# Patient Record
Sex: Female | Born: 1935 | Race: White | Hispanic: No | Smoking: Former smoker
Health system: Southern US, Community
[De-identification: ages and names within clinical notes are randomized; demographics above are authoritative.]

## PROBLEM LIST (undated history)

## (undated) DIAGNOSIS — A6000 Herpesviral infection of urogenital system, unspecified: Secondary | ICD-10-CM

## (undated) DIAGNOSIS — F32A Depression, unspecified: Secondary | ICD-10-CM

## (undated) DIAGNOSIS — I509 Heart failure, unspecified: Secondary | ICD-10-CM

## (undated) DIAGNOSIS — D649 Anemia, unspecified: Secondary | ICD-10-CM

## (undated) DIAGNOSIS — K2970 Gastritis, unspecified, without bleeding: Secondary | ICD-10-CM

## (undated) DIAGNOSIS — I10 Essential (primary) hypertension: Secondary | ICD-10-CM

## (undated) DIAGNOSIS — R55 Syncope and collapse: Secondary | ICD-10-CM

## (undated) DIAGNOSIS — E041 Nontoxic single thyroid nodule: Secondary | ICD-10-CM

## (undated) DIAGNOSIS — M545 Low back pain, unspecified: Secondary | ICD-10-CM

## (undated) DIAGNOSIS — E039 Hypothyroidism, unspecified: Secondary | ICD-10-CM

## (undated) DIAGNOSIS — Z8719 Personal history of other diseases of the digestive system: Secondary | ICD-10-CM

## (undated) DIAGNOSIS — F329 Major depressive disorder, single episode, unspecified: Secondary | ICD-10-CM

## (undated) DIAGNOSIS — T4145XA Adverse effect of unspecified anesthetic, initial encounter: Secondary | ICD-10-CM

## (undated) DIAGNOSIS — R0982 Postnasal drip: Secondary | ICD-10-CM

## (undated) DIAGNOSIS — R9439 Abnormal result of other cardiovascular function study: Secondary | ICD-10-CM

## (undated) DIAGNOSIS — R32 Unspecified urinary incontinence: Secondary | ICD-10-CM

## (undated) DIAGNOSIS — F419 Anxiety disorder, unspecified: Secondary | ICD-10-CM

## (undated) DIAGNOSIS — G479 Sleep disorder, unspecified: Secondary | ICD-10-CM

## (undated) DIAGNOSIS — G8929 Other chronic pain: Secondary | ICD-10-CM

## (undated) DIAGNOSIS — M1990 Unspecified osteoarthritis, unspecified site: Secondary | ICD-10-CM

## (undated) DIAGNOSIS — T8859XA Other complications of anesthesia, initial encounter: Secondary | ICD-10-CM

## (undated) DIAGNOSIS — C4441 Basal cell carcinoma of skin of scalp and neck: Secondary | ICD-10-CM

## (undated) DIAGNOSIS — H409 Unspecified glaucoma: Secondary | ICD-10-CM

## (undated) DIAGNOSIS — Z8711 Personal history of peptic ulcer disease: Secondary | ICD-10-CM

## (undated) DIAGNOSIS — Z87440 Personal history of urinary (tract) infections: Secondary | ICD-10-CM

## (undated) DIAGNOSIS — I447 Left bundle-branch block, unspecified: Secondary | ICD-10-CM

## (undated) DIAGNOSIS — M810 Age-related osteoporosis without current pathological fracture: Secondary | ICD-10-CM

## (undated) HISTORY — PX: TONSILLECTOMY: SUR1361

## (undated) HISTORY — PX: TUBAL LIGATION: SHX77

## (undated) HISTORY — DX: Heart failure, unspecified: I50.9

## (undated) HISTORY — PX: JOINT REPLACEMENT: SHX530

## (undated) HISTORY — PX: BASAL CELL CARCINOMA EXCISION: SHX1214

## (undated) HISTORY — PX: TOTAL HIP ARTHROPLASTY: SHX124

## (undated) HISTORY — DX: Unspecified osteoarthritis, unspecified site: M19.90

## (undated) HISTORY — DX: Unspecified glaucoma: H40.9

## (undated) HISTORY — PX: CARDIAC CATHETERIZATION: SHX172

## (undated) HISTORY — PX: BIOPSY THYROID: PRO38

## (undated) HISTORY — PX: APPENDECTOMY: SHX54

## (undated) HISTORY — DX: Abnormal result of other cardiovascular function study: R94.39

## (undated) HISTORY — DX: Left bundle-branch block, unspecified: I44.7

## (undated) HISTORY — PX: CATARACT EXTRACTION W/ INTRAOCULAR LENS  IMPLANT, BILATERAL: SHX1307

---

## 1998-02-17 ENCOUNTER — Ambulatory Visit (HOSPITAL_COMMUNITY): Admission: RE | Admit: 1998-02-17 | Discharge: 1998-02-17 | Payer: Self-pay | Admitting: Obstetrics & Gynecology

## 1998-07-04 ENCOUNTER — Ambulatory Visit (HOSPITAL_COMMUNITY): Admission: RE | Admit: 1998-07-04 | Discharge: 1998-07-04 | Payer: Self-pay | Admitting: Obstetrics & Gynecology

## 1999-07-24 ENCOUNTER — Other Ambulatory Visit: Admission: RE | Admit: 1999-07-24 | Discharge: 1999-07-24 | Payer: Self-pay | Admitting: Obstetrics & Gynecology

## 1999-07-24 ENCOUNTER — Ambulatory Visit (HOSPITAL_COMMUNITY): Admission: RE | Admit: 1999-07-24 | Discharge: 1999-07-24 | Payer: Self-pay | Admitting: *Deleted

## 1999-07-24 ENCOUNTER — Ambulatory Visit (HOSPITAL_COMMUNITY): Admission: RE | Admit: 1999-07-24 | Discharge: 1999-07-24 | Payer: Self-pay | Admitting: Obstetrics & Gynecology

## 2000-07-29 ENCOUNTER — Ambulatory Visit (HOSPITAL_COMMUNITY): Admission: RE | Admit: 2000-07-29 | Discharge: 2000-07-29 | Payer: Self-pay | Admitting: *Deleted

## 2000-08-10 ENCOUNTER — Ambulatory Visit (HOSPITAL_COMMUNITY): Admission: RE | Admit: 2000-08-10 | Discharge: 2000-08-10 | Payer: Self-pay

## 2001-08-11 ENCOUNTER — Ambulatory Visit (HOSPITAL_COMMUNITY): Admission: RE | Admit: 2001-08-11 | Discharge: 2001-08-11 | Payer: Self-pay | Admitting: *Deleted

## 2002-08-17 ENCOUNTER — Ambulatory Visit (HOSPITAL_COMMUNITY): Admission: RE | Admit: 2002-08-17 | Discharge: 2002-08-17 | Payer: Self-pay | Admitting: *Deleted

## 2002-08-17 ENCOUNTER — Ambulatory Visit (HOSPITAL_COMMUNITY): Admission: RE | Admit: 2002-08-17 | Discharge: 2002-08-17 | Payer: Self-pay | Admitting: Family Medicine

## 2003-06-21 ENCOUNTER — Ambulatory Visit (HOSPITAL_COMMUNITY): Admission: RE | Admit: 2003-06-21 | Discharge: 2003-06-21 | Payer: Self-pay | Admitting: *Deleted

## 2003-08-20 ENCOUNTER — Ambulatory Visit (HOSPITAL_COMMUNITY): Admission: RE | Admit: 2003-08-20 | Discharge: 2003-08-20 | Payer: Self-pay | Admitting: Family Medicine

## 2004-08-20 ENCOUNTER — Ambulatory Visit: Payer: Self-pay | Admitting: *Deleted

## 2004-08-20 ENCOUNTER — Ambulatory Visit (HOSPITAL_COMMUNITY): Admission: RE | Admit: 2004-08-20 | Discharge: 2004-08-20 | Payer: Self-pay | Admitting: Surgery

## 2005-10-06 ENCOUNTER — Ambulatory Visit (HOSPITAL_COMMUNITY): Admission: RE | Admit: 2005-10-06 | Discharge: 2005-10-06 | Payer: Self-pay | Admitting: Surgery

## 2005-10-21 ENCOUNTER — Ambulatory Visit: Payer: Self-pay | Admitting: *Deleted

## 2005-10-28 ENCOUNTER — Encounter: Admission: RE | Admit: 2005-10-28 | Discharge: 2005-10-28 | Payer: Self-pay | Admitting: Surgery

## 2006-05-11 ENCOUNTER — Inpatient Hospital Stay (HOSPITAL_BASED_OUTPATIENT_CLINIC_OR_DEPARTMENT_OTHER): Admission: RE | Admit: 2006-05-11 | Discharge: 2006-05-11 | Payer: Self-pay | Admitting: Cardiovascular Disease

## 2006-07-14 ENCOUNTER — Encounter (HOSPITAL_COMMUNITY): Admission: RE | Admit: 2006-07-14 | Discharge: 2006-10-12 | Payer: Self-pay | Admitting: Cardiovascular Disease

## 2006-10-13 ENCOUNTER — Encounter (HOSPITAL_COMMUNITY): Admission: RE | Admit: 2006-10-13 | Discharge: 2006-10-22 | Payer: Self-pay | Admitting: Cardiology

## 2006-10-25 ENCOUNTER — Encounter (HOSPITAL_COMMUNITY): Admission: RE | Admit: 2006-10-25 | Discharge: 2007-01-17 | Payer: Self-pay | Admitting: Cardiovascular Disease

## 2007-01-18 ENCOUNTER — Encounter (HOSPITAL_COMMUNITY): Admission: RE | Admit: 2007-01-18 | Discharge: 2007-01-18 | Payer: Self-pay | Admitting: Cardiovascular Disease

## 2007-07-25 ENCOUNTER — Encounter: Admission: RE | Admit: 2007-07-25 | Discharge: 2007-07-25 | Payer: Self-pay | Admitting: Orthopedic Surgery

## 2008-01-15 ENCOUNTER — Encounter: Admission: RE | Admit: 2008-01-15 | Discharge: 2008-01-15 | Payer: Self-pay | Admitting: Neurology

## 2008-02-12 ENCOUNTER — Encounter: Admission: RE | Admit: 2008-02-12 | Discharge: 2008-02-21 | Payer: Self-pay | Admitting: Neurology

## 2010-07-29 ENCOUNTER — Inpatient Hospital Stay (HOSPITAL_COMMUNITY): Admission: RE | Admit: 2010-07-29 | Discharge: 2010-08-03 | Payer: Self-pay | Admitting: Orthopedic Surgery

## 2010-07-29 ENCOUNTER — Ambulatory Visit: Payer: Self-pay | Admitting: Internal Medicine

## 2010-07-31 ENCOUNTER — Ambulatory Visit: Payer: Self-pay | Admitting: Vascular Surgery

## 2010-07-31 ENCOUNTER — Encounter (INDEPENDENT_AMBULATORY_CARE_PROVIDER_SITE_OTHER): Payer: Self-pay | Admitting: Internal Medicine

## 2010-11-08 ENCOUNTER — Encounter: Payer: Self-pay | Admitting: Surgery

## 2010-12-22 ENCOUNTER — Other Ambulatory Visit: Payer: Self-pay | Admitting: Orthopedic Surgery

## 2010-12-22 ENCOUNTER — Encounter (HOSPITAL_COMMUNITY): Payer: Medicare Other

## 2010-12-22 ENCOUNTER — Ambulatory Visit (HOSPITAL_COMMUNITY)
Admission: RE | Admit: 2010-12-22 | Discharge: 2010-12-22 | Disposition: A | Discharge status: Home / Self Care | Payer: Medicare Other | Source: Ambulatory Visit | Attending: Orthopedic Surgery | Admitting: Orthopedic Surgery

## 2010-12-22 ENCOUNTER — Other Ambulatory Visit (HOSPITAL_COMMUNITY): Payer: Self-pay | Admitting: Orthopedic Surgery

## 2010-12-22 DIAGNOSIS — M169 Osteoarthritis of hip, unspecified: Secondary | ICD-10-CM

## 2010-12-22 DIAGNOSIS — Z01812 Encounter for preprocedural laboratory examination: Secondary | ICD-10-CM | POA: Insufficient documentation

## 2010-12-22 DIAGNOSIS — M1610 Unilateral primary osteoarthritis, unspecified hip: Secondary | ICD-10-CM | POA: Insufficient documentation

## 2010-12-22 DIAGNOSIS — Z96649 Presence of unspecified artificial hip joint: Secondary | ICD-10-CM | POA: Insufficient documentation

## 2010-12-22 DIAGNOSIS — Z01811 Encounter for preprocedural respiratory examination: Secondary | ICD-10-CM | POA: Insufficient documentation

## 2010-12-22 LAB — URINALYSIS, ROUTINE W REFLEX MICROSCOPIC
Bilirubin Urine: NEGATIVE
Glucose, UA: NEGATIVE mg/dL
Hgb urine dipstick: NEGATIVE
Ketones, ur: NEGATIVE mg/dL
Nitrite: NEGATIVE
Protein, ur: NEGATIVE mg/dL
Specific Gravity, Urine: 1.006 (ref 1.005–1.030)
Urobilinogen, UA: 0.2 mg/dL (ref 0.0–1.0)
pH: 6.5 (ref 5.0–8.0)

## 2010-12-22 LAB — CBC
HCT: 44.7 % (ref 36.0–46.0)
Hemoglobin: 14.7 g/dL (ref 12.0–15.0)
MCH: 30.6 pg (ref 26.0–34.0)
MCHC: 32.9 g/dL (ref 30.0–36.0)
MCV: 92.9 fL (ref 78.0–100.0)
RBC: 4.81 MIL/uL (ref 3.87–5.11)
RDW: 13.4 % (ref 11.5–15.5)

## 2010-12-22 LAB — COMPREHENSIVE METABOLIC PANEL
ALT: 16 U/L (ref 0–35)
AST: 22 U/L (ref 0–37)
BUN: 11 mg/dL (ref 6–23)
CO2: 32 mEq/L (ref 19–32)
Calcium: 9.4 mg/dL (ref 8.4–10.5)
Chloride: 100 mEq/L (ref 96–112)
Creatinine, Ser: 0.73 mg/dL (ref 0.4–1.2)
GFR calc non Af Amer: >60 mL/min (ref >60)
Glucose, Bld: 84 mg/dL (ref 70–99)
Total Bilirubin: 0.9 mg/dL (ref 0.3–1.2)
Total Protein: 6.4 g/dL (ref 6.0–8.3)

## 2010-12-22 LAB — SURGICAL PCR SCREEN
MRSA, PCR: NEGATIVE
Staphylococcus aureus: NEGATIVE

## 2010-12-22 LAB — APTT: aPTT: 33 seconds (ref 24–37)

## 2010-12-22 LAB — PROTIME-INR
INR: 1 (ref 0.00–1.49)
Prothrombin Time: 13.4 seconds (ref 11.6–15.2)

## 2010-12-28 ENCOUNTER — Inpatient Hospital Stay (HOSPITAL_COMMUNITY): Payer: Medicare Other

## 2010-12-28 ENCOUNTER — Inpatient Hospital Stay (HOSPITAL_COMMUNITY)
Admission: RE | Admit: 2010-12-28 | Discharge: 2010-12-31 | DRG: 470 | Disposition: A | Discharge status: Skilled Nursing Facility | Payer: Medicare Other | Source: Ambulatory Visit | Attending: Orthopedic Surgery | Admitting: Orthopedic Surgery

## 2010-12-28 DIAGNOSIS — M169 Osteoarthritis of hip, unspecified: Principal | ICD-10-CM | POA: Diagnosis present

## 2010-12-28 DIAGNOSIS — D62 Acute posthemorrhagic anemia: Secondary | ICD-10-CM | POA: Diagnosis not present

## 2010-12-28 DIAGNOSIS — M1610 Unilateral primary osteoarthritis, unspecified hip: Principal | ICD-10-CM | POA: Diagnosis present

## 2010-12-28 DIAGNOSIS — Z96649 Presence of unspecified artificial hip joint: Secondary | ICD-10-CM

## 2010-12-28 DIAGNOSIS — H9319 Tinnitus, unspecified ear: Secondary | ICD-10-CM | POA: Diagnosis present

## 2010-12-28 DIAGNOSIS — E039 Hypothyroidism, unspecified: Secondary | ICD-10-CM | POA: Diagnosis present

## 2010-12-28 DIAGNOSIS — F411 Generalized anxiety disorder: Secondary | ICD-10-CM | POA: Diagnosis present

## 2010-12-28 DIAGNOSIS — I10 Essential (primary) hypertension: Secondary | ICD-10-CM | POA: Diagnosis present

## 2010-12-28 DIAGNOSIS — H409 Unspecified glaucoma: Secondary | ICD-10-CM | POA: Diagnosis present

## 2010-12-28 LAB — TYPE AND SCREEN: ABO/RH(D): O POS

## 2010-12-29 LAB — CBC
HCT: 29.2 % — ABNORMAL LOW (ref 36.0–46.0)
Hemoglobin: 9.8 g/dL — ABNORMAL LOW (ref 12.0–15.0)
MCHC: 33.6 g/dL (ref 30.0–36.0)
MCV: 93 fL (ref 78.0–100.0)
Platelets: 167 10*3/uL (ref 150–400)
RDW: 13.2 % (ref 11.5–15.5)
WBC: 6.2 10*3/uL (ref 4.0–10.5)

## 2010-12-29 LAB — BASIC METABOLIC PANEL
BUN: 5 mg/dL — ABNORMAL LOW (ref 6–23)
CO2: 29 mEq/L (ref 19–32)
Calcium: 8.3 mg/dL — ABNORMAL LOW (ref 8.4–10.5)
Chloride: 104 mEq/L (ref 96–112)
Creatinine, Ser: 0.81 mg/dL (ref 0.4–1.2)
GFR calc non Af Amer: >60 mL/min (ref >60)
Glucose, Bld: 203 mg/dL — ABNORMAL HIGH (ref 70–99)
Potassium: 4.9 mEq/L (ref 3.5–5.1)
Sodium: 138 mEq/L (ref 135–145)

## 2010-12-30 LAB — BASIC METABOLIC PANEL
BUN: 5 mg/dL — ABNORMAL LOW (ref 6–23)
BUN: 9 mg/dL (ref 6–23)
CO2: 32 mEq/L (ref 19–32)
CO2: 31 mEq/L (ref 19–32)
Calcium: 8.5 mg/dL (ref 8.4–10.5)
Calcium: 8.3 mg/dL — ABNORMAL LOW (ref 8.4–10.5)
Chloride: 100 mEq/L (ref 96–112)
Creatinine, Ser: 0.75 mg/dL (ref 0.4–1.2)
Creatinine, Ser: 0.68 mg/dL (ref 0.4–1.2)
GFR calc Af Amer: >60 mL/min (ref >60)
GFR calc Af Amer: >60 mL/min (ref >60)
GFR calc non Af Amer: >60 mL/min (ref >60)
GFR calc non Af Amer: >60 mL/min (ref >60)
Glucose, Bld: 147 mg/dL — ABNORMAL HIGH (ref 70–99)
Glucose, Bld: 141 mg/dL — ABNORMAL HIGH (ref 70–99)
Potassium: 4.3 mEq/L (ref 3.5–5.1)
Potassium: 4.1 mEq/L (ref 3.5–5.1)
Sodium: 142 mEq/L (ref 135–145)
Sodium: 136 mEq/L (ref 135–145)

## 2010-12-30 LAB — CBC
HCT: 28 % — ABNORMAL LOW (ref 36.0–46.0)
HCT: 33.2 % — ABNORMAL LOW (ref 36.0–46.0)
Hemoglobin: 9.1 g/dL — ABNORMAL LOW (ref 12.0–15.0)
Hemoglobin: 11.5 g/dL — ABNORMAL LOW (ref 12.0–15.0)
MCH: 30.3 pg (ref 26.0–34.0)
MCH: 30.7 pg (ref 26.0–34.0)
MCHC: 32.5 g/dL (ref 30.0–36.0)
MCHC: 34.4 g/dL (ref 30.0–36.0)
MCV: 93.3 fL (ref 78.0–100.0)
MCV: 89.2 fL (ref 78.0–100.0)
Platelets: 156 10*3/uL (ref 150–400)
Platelets: 118 10*3/uL — ABNORMAL LOW (ref 150–400)
RBC: 3.72 MIL/uL — ABNORMAL LOW (ref 3.87–5.11)
RDW: 13.2 % (ref 11.5–15.5)
RDW: 17.6 % — ABNORMAL HIGH (ref 11.5–15.5)
WBC: 7.1 10*3/uL (ref 4.0–10.5)

## 2010-12-30 LAB — PROTIME-INR
INR: 1.54 — ABNORMAL HIGH (ref 0.00–1.49)
INR: 1.77 — ABNORMAL HIGH (ref 0.00–1.49)
Prothrombin Time: 20.8 seconds — ABNORMAL HIGH (ref 11.6–15.2)
Prothrombin Time: 21.5 seconds — ABNORMAL HIGH (ref 11.6–15.2)

## 2010-12-30 LAB — CARDIAC PANEL(CRET KIN+CKTOT+MB+TROPI)
CK, MB: 6.3 ng/mL (ref 0.3–4.0)
Relative Index: 6.1 — ABNORMAL HIGH (ref 0.0–2.5)
Total CK: 103 U/L (ref 7–177)
Troponin I: 0.99 ng/mL (ref 0.00–0.06)

## 2010-12-30 NOTE — H&P (Addendum)
NAME:  Maria [name], Maria [name] NO.:  [id number]  MEDICAL RECORD NO.:  [id number]           PATIENT TYPE:  LOCATION:                                 FACILITY:  PHYSICIAN:  Ollen Gross, M.D.    DATE OF BIRTH:  [birth year]  DATE OF ADMISSION: DATE OF DISCHARGE:                             HISTORY & PHYSICAL   CHIEF COMPLAINT:  Right hip pain.  BRIEF HISTORY:  Maria [name] has been followed by Dr. Lequita Halt for worsening pain in her right hip.  She had a left total hip arthroplasty about 5- 1/2 months ago.  She has done very well with that.  She now feels that the right hip continues to worsen and is now preventing her from doing things she would like to do.  She now presents for right total hip arthroplasty.  Maria [name] has been cleared for surgery by her primary care physician, Dr. Asencion Islam, and she is noted to be a low risk.  MEDICATION ALLERGIES:  She states she is sensitive to ASPIRIN.  This has caused her to have ulcers in the past.  CURRENT MEDICATIONS: 1. Carvedilol. 2. Lisinopril. 3. Armour Thyroid. 4. Venlafaxine. 5. Gabapentin. 6. Zovirax. 7. Dulse. 8. Lanney Gins San. 9. Fish oil. 10.Vitamin D3. 11.Lutein. 12.BCQ.  Maria [name] will discontinue all supplements prior to surgery.  PAST MEDICAL HISTORY: 1. End-stage arthritis of right hip. 2. Tinnitus. 3. Glaucoma. 4. Cataracts. 5. Impaired vision. 6. History of heart failure. 7. Thyroid disease. 8. Arthritis. 9. Degenerative disk disease.  PAST SURGICAL HISTORY:  Left total hip arthroplasty October 2011.  FAMILY HISTORY:  Father passed at age 6, old age.  Mother passed at 1. She had heart trouble.  SOCIAL HISTORY:  The patient is single.  She works as a Clinical research associate.  She denies past or present use of tobacco products.  She states she has an occasional glass of wine.  She has 2 children.  She lives alone.  She will need to go to rehab facility following her hospital stay and her first choice is [place] and  second choice is [facility].  REVIEW OF SYSTEMS:  GENERAL:  Negative for fevers, chills, or weight change.  HEENT/NEUROLOGIC:  Positive for ringing in the ears.  Negative for headache or blurred vision.  DERMATOLOGIC:  Negative for rash or lesion.  RESPIRATORY:  Negative for shortness of breath at rest or on exertion.  CARDIOVASCULAR:  Positive for slight swelling.  Negative for chest pain or palpitations.  GASTROINTESTINAL:  Negative for nausea, vomiting, or diarrhea.  GENITOURINARY:  Positive for urinary frequency, urinating at nighttime, and urinary incontinence.  MUSCULOSKELETAL: Positive for joint pain and morning stiffness.  PHYSICAL EXAMINATION:  VITAL SIGNS:  Pulse 80, respirations 18, blood pressure 118/70 in the left arm. GENERAL:  Maria [name] is a pleasant 77 year old female.  She is a stated height of 5 feet 5 inches and a stated weight of 140 pounds.  She is alert and oriented in no distress. HEENT:  Normocephalic, atraumatic.  Extraocular movements intact.  The patient wears glasses. NECK:  Supple, full range of motion  without lymphadenopathy. CHEST:  Lungs are clear to auscultation. HEART:  Regular rate and rhythm without murmur, S1 and S2 sounds appreciated. ABDOMEN:  Bowel sounds present in all 4 quadrants. EXTREMITIES:  Right hip significantly decreased range of motion with pain throughout the range. SKIN:  Unremarkable. NEUROLOGIC:  Intact. PERIPHERAL VASCULAR:  Carotid pulses 2+ bilaterally without bruit.  RADIOGRAPHS:  End-stage arthritis of the right hip.  IMPRESSION:  End-stage arthritis of the right hip.  PLAN:  Right total hip arthroplasty to be performed by Dr. Lequita Halt.     Rozell Searing, PAC   ______________________________ Ollen Gross, M.D.    LD/MEDQ  D:  12/29/2010  T:  12/30/2010  Job:  [id number]  Electronically Signed by Rozell Searing  on 12/30/2010 10:44:30 AM Electronically Signed by Ollen Gross M.D. on 01/06/2011 08:18:38 AM

## 2010-12-31 LAB — BASIC METABOLIC PANEL
BUN: 9 mg/dL (ref 6–23)
Calcium: 8.1 mg/dL — ABNORMAL LOW (ref 8.4–10.5)
Chloride: 99 mEq/L (ref 96–112)
Creatinine, Ser: 0.8 mg/dL (ref 0.4–1.2)
Creatinine, Ser: 0.86 mg/dL (ref 0.4–1.2)
GFR calc Af Amer: >60 mL/min (ref >60)
GFR calc non Af Amer: >60 mL/min (ref >60)
Glucose, Bld: 153 mg/dL — ABNORMAL HIGH (ref 70–99)
Potassium: 4.7 mEq/L (ref 3.5–5.1)

## 2010-12-31 LAB — TYPE AND SCREEN
Antibody Screen: NEGATIVE
Unit division: 0

## 2010-12-31 LAB — COMPREHENSIVE METABOLIC PANEL
AST: 22 U/L (ref 0–37)
BUN: 14 mg/dL (ref 6–23)
CO2: 30 mEq/L (ref 19–32)
Chloride: 109 mEq/L (ref 96–112)
Creatinine, Ser: 0.71 mg/dL (ref 0.4–1.2)
GFR calc non Af Amer: >60 mL/min (ref >60)
Glucose, Bld: 112 mg/dL — ABNORMAL HIGH (ref 70–99)
Total Bilirubin: 0.4 mg/dL (ref 0.3–1.2)

## 2010-12-31 LAB — URINALYSIS, ROUTINE W REFLEX MICROSCOPIC
Bilirubin Urine: NEGATIVE
Bilirubin Urine: NEGATIVE
Hgb urine dipstick: NEGATIVE
Ketones, ur: NEGATIVE mg/dL
Nitrite: NEGATIVE
Specific Gravity, Urine: 1.009 (ref 1.005–1.030)
Urobilinogen, UA: 0.2 mg/dL (ref 0.0–1.0)
pH: 6 (ref 5.0–8.0)

## 2010-12-31 LAB — CBC
HCT: 24.8 % — ABNORMAL LOW (ref 36.0–46.0)
HCT: 42.7 % (ref 36.0–46.0)
Hemoglobin: 15.1 g/dL — ABNORMAL HIGH (ref 12.0–15.0)
Hemoglobin: 8.4 g/dL — ABNORMAL LOW (ref 12.0–15.0)
MCH: 33.1 pg (ref 26.0–34.0)
MCHC: 34 g/dL (ref 30.0–36.0)
MCV: 93.7 fL (ref 78.0–100.0)
MCV: 93.7 fL (ref 78.0–100.0)
MCV: 95.2 fL (ref 78.0–100.0)
Platelets: 114 10*3/uL — ABNORMAL LOW (ref 150–400)
Platelets: 141 10*3/uL — ABNORMAL LOW (ref 150–400)
RBC: 2.89 MIL/uL — ABNORMAL LOW (ref 3.87–5.11)
RBC: 4.56 MIL/uL (ref 3.87–5.11)
RBC: 2.71 MIL/uL — ABNORMAL LOW (ref 3.87–5.11)
RDW: 13.6 % (ref 11.5–15.5)
WBC: 6.6 10*3/uL (ref 4.0–10.5)

## 2010-12-31 LAB — PROTIME-INR
INR: 0.95 (ref 0.00–1.49)
INR: 1.11 (ref 0.00–1.49)
INR: 1.38 (ref 0.00–1.49)
Prothrombin Time: 12.9 seconds (ref 11.6–15.2)
Prothrombin Time: 14.5 seconds (ref 11.6–15.2)

## 2010-12-31 LAB — CARDIAC PANEL(CRET KIN+CKTOT+MB+TROPI)
CK, MB: 5.1 ng/mL — ABNORMAL HIGH (ref 0.3–4.0)
CK, MB: 5.3 ng/mL — ABNORMAL HIGH (ref 0.3–4.0)
CK, MB: 5.5 ng/mL — ABNORMAL HIGH (ref 0.3–4.0)
CK, MB: 9.7 ng/mL (ref 0.3–4.0)
Relative Index: 3.2 — ABNORMAL HIGH (ref 0.0–2.5)
Relative Index: 3.8 — ABNORMAL HIGH (ref 0.0–2.5)
Total CK: 151 U/L (ref 7–177)
Troponin I: 0.33 ng/mL — ABNORMAL HIGH (ref 0.00–0.06)
Troponin I: 1.52 ng/mL (ref 0.00–0.06)

## 2010-12-31 LAB — ABO/RH: ABO/RH(D): O POS

## 2010-12-31 LAB — URINE CULTURE: Special Requests: NEGATIVE

## 2010-12-31 LAB — APTT: aPTT: 31 seconds (ref 24–37)

## 2011-01-06 NOTE — Op Note (Signed)
NAME:  Maria [name], Maria [name] NO.:  [id number]  MEDICAL RECORD NO.:  [id number]           PATIENT TYPE:  I  LOCATION:  1619                         FACILITY:  [facility]  PHYSICIAN:  Ollen Gross, M.D.    DATE OF BIRTH:  [birth year]  DATE OF PROCEDURE:  12/28/2010 DATE OF DISCHARGE:                              OPERATIVE REPORT   PREOPERATIVE DIAGNOSIS:  Osteoarthritis, right hip.  POSTOPERATIVE DIAGNOSIS:  Osteoarthritis, right hip.  PROCEDURE:  Right total hip arthroplasty.  SURGEON:  Ollen Gross, MD  ASSISTANT:  Alexzandrew L. Julien Girt, PA-C  ANESTHESIA:  General.  ESTIMATED BLOOD LOSS:  500.  DRAIN:  Hemovac x1.  COMPLICATIONS:  None.  CONDITION:  Stable to Recovery.  BRIEF CLINICAL NOTE:  Maria [name] is a 77 year old female with advanced end- stage arthritis of the right hip with progressively worsening pain and dysfunction.  She had a recent successful left total hip arthroplasty and presents now for right total hip arthroplasty.  PROCEDURE IN DETAIL:  After successful administration of general anesthetic, the patient was placed in left lateral decubitus position with the right side up and held a hip positioner.  Right lower extremity was isolated from its perineum with plastic drapes and prepped and draped in the usual sterile fashion.  Short posterolateral incision was made with a 10 blade through subcutaneous tissue to the fascia lata which was incised in line with the skin incision.  The sciatic nerve was palpated and protected and short rotators and capsule isolated off the femur.  Hip was dislocated and center of the femoral head was marked.  A trial prosthesis was placed such that the center of the trial head corresponded to the center of the native femoral head.  Osteotomy line was marked on the femoral neck and osteotomy made with an oscillating saw.  Femoral head was then removed.  Retractors were placed around the proximal femur to gain  access to the canal.  The starter reamer was passed into the femoral canal and the canal was thoroughly irrigated to remove fatty contents.  Axial reaming was performed with a 15.5 mm proximal, reaming to 103F and the sleeve machined to a large.  An 103F large trial sleeve was placed.  The femur was retracted anteriorly to gain acetabular exposure. Acetabular retractors were placed and labrum and osteophytes removed. She had a massive rim of circumferential osteophyte which was removed. I reamed up to 49 mm of placement of 50 mm Pinnacle acetabular shell which was placed in anatomic position and transfixed with 2 dome screws. Apex hole eliminator was placed and the 32 mm neutral +4 Marathon liner was placed.  The trial stem was then placed which was a 20 x 15 with a 36 +8 neck matching native anteversion.  A 32 +0 head was placed and the hip was reduced with outstanding stability.  She had full extension, full external rotation, 70 degrees flexion, 40 degrees adduction, 90 degrees internal rotation, 90 degrees of flexion, and 70 degrees of internal rotation.  By placing the right leg on top of the left, it felt as though leg  lengths were equal.  Hip was dislocated and trials removed.  The permanent 75F large sleeve was placed. The 75F large permanent sleeve was placed.  The 20 x 15 stem with a 36 +8 neck was placed matching native anteversion.  A 32 +0 head was placed and the hip was reduced with the same stability parameters.  Wound was copiously irrigated with saline solution and then the short rotators and capsule reattached to the femur through drill holes with Ethibond suture. Fascia lata was closed over Hemovac drain with interrupted #1 Vicryl, subcutaneous closed with #1-0 and #2-0 Vicryl, and subcuticular with running 4-0 Monocryl.  Drain was hooked to suction.  Catheter for the Marcaine pain pump was placed and the pump was initiated.  Incision was cleaned and dried and  Steri-Strips and a bulky sterile dressing applied. She was placed into a knee immobilizer, awakened, and transported to Recovery in stable condition.     Ollen Gross, M.D.     FA/MEDQ  D:  12/28/2010  T:  12/28/2010  Job:  [id number]  Electronically Signed by Ollen Gross M.D. on 01/06/2011 08:18:31 AM

## 2011-01-11 NOTE — Discharge Summary (Signed)
NAME:  Maria [name], DEMARAIS NO.:  [id number]  MEDICAL RECORD NO.:  [id number]           PATIENT TYPE:  I  LOCATION:  1619                         FACILITY:  [facility]  PHYSICIAN:  Ollen Gross, M.D.    DATE OF BIRTH:  [birth year]  DATE OF ADMISSION:  12/28/2010 DATE OF DISCHARGE:  12/31/2010                        DISCHARGE SUMMARY - REFERRING   PRIMARY CARE PHYSICIAN:  Austin Miles. Asencion Islam, M.D.  ADMITTING DIAGNOSES: 1. Osteoarthritis right hip. 2. Tinnitus. 3. Glaucoma. 4. Cataracts number 5. History of heart failure. 6. Hypothyroidism. 7. Osteoarthritis. 8. Degenerative disk disease.  DISCHARGE DIAGNOSES: 1. Osteoarthritis, right hip, status post right total hip replacement     arthroplasty. 2. Postop acute blood loss anemia, did not require transfusion. 3. Tinnitus. 4. Glaucoma. 5. Cataracts number 6. History of heart failure. 7. Hypothyroidism. 8. Osteoarthritis. 9. Degenerative disk disease.PROCEDURE:  December 28, 2010, right total hip.  Surgeon, Dr. Lequita Halt. Assistant, Alexzandrew L. Perkins, P.A.C.  Anesthesia, general.  Blood loss, 500 cc.  CONSULTS:  None.  BRIEF HISTORY:  The patient is a 77 year old female with advanced arthritis of the right hip with progressive worsening pain and dysfunction.  She has had a successful left total hip and now presents for right total hip.  LABORATORY DATA:  Preop CBC showed hemoglobin of 14.79, hematocrit of 44.7, white cell count 6, platelets 205.  PT/INR 13.4 and 1 with PTT of 33.  Chem panel on admission all within normal limits.  Preop UA was negative.  Blood group type is O positive.  Nasal swabs were negative for staph aureus and negative for MRSA.  Serial CBCs were followed. Hemoglobin was down to 9.8 on postop day #1, then 9.1 last night.  H and H was 8.4 and 25.2.  Serial BMETs were followed for 48 hours. Electrolytes remained all within normal limits but the glucose went up from 83 to 203, back down  to 147.  EKG dated July 30, 2010, normal sinus rhythm with sinus arrhythmia, left axis deviation, left bundle- branch block, since previous tracing rate is faster July 21, 2010, confirmed by Dr. Viann Fish.  HOSPITAL COURSE:  The patient admitted to [facility], taken to OR, underwent above-stated procedure without complications.  The patient tolerated the procedure well, later transferred to recovery room on orthopedic floor.  She was given 24 hours postop IV antibiotics.  She was started on p.o. and IV analgesics for pain control following surgery.  She was also placed on Xarelto for DVT prophylaxis.  She was allowed to be partial weightbearing 25% to 50% to the right lower extremity.  She is actually doing pretty well on the morning of day #1, started getting up out of bed with therapy.  She was started back on her home meds.  She did want to look into skilled facility, so we got social work involved right away.  Hemoglobin was down a little bit to 9.8, so we put her on some iron supplementation.  By day #2, she was doing well, she was up walking with therapy progressing short distances.  Dressing was changed.  Incision looked  good.  No signs of infection.  By the morning of day #3, her hemoglobin was down to 8.4 but she was asymptomatic with this.  Her pressures was fine and her pulse was fine. Therefore, we are just going to keep her on the iron supplement.  It was noted that bed should be available at [facility] and she is progressing with therapy.  Once the bed was confirmed, she will be transferred over today on December 31, 2010.  DISCHARGE/PLAN: 1. The patient to be transferred to [facility] on December 31, 2010. 2. Discharge diagnoses, please see above. 3. Discharge medications.  Current medications at the time of transfer include: 1. Neurontin 600 mg p.o. q.h.s. 2. Effexor 25 mg p.o. q.h.s. 3. Armour Thyroid 30 mg p.o. q.a.m. 4. Coreg 3.125 mg  p.o. twice a day. 5. Lisinopril 5 mg p.o. q.a.m. 6. Xarelto 10 mg p.o. daily for 3 weeks, then discontinue the Xarelto. 7. Colace 100 mg p.o. b.i.d. 8. Nu-Iron 150 mg p.o. b.i.d. for 3 weeks, then discontinue the Nu-     Iron. 9. Astelin nasal spray 137 mcg nasally daily p.r.n. 10.Celebrex 200 mg p.o. daily p.r.n. pain. 11.Acyclovir 5% ointment p.r.n. outbreak topical. 12.Tylenol 325 one or two every 4 to 6 hours as needed for mild pain,     temperature or headache. 13.Laxative of choice. 14.Enema of choice. 15.Robaxin 500 mg p.o. q.6 h p.r.n. spasm. 16.Percocet 5 mg 1 or 2 every 4 to 6 hours as needed for moderate to     severe pain.  DIET:  Heart-healthy diet as tolerated.  ACTIVITY:  She is partial weightbearing to the right lower extremity, 25- 50% weightbearing.  Hip precautions, total hip protocol.  Please note the patient may start showering once she is transferred to [facility], however, do not submerge the incision under water; daily dressing change.  FOLLOWUP:  The patient would need to follow up Dr. Lequita Halt on Tuesday, the 27th.  Please contact the office at [phone] to help arrange appointment followup care of this patient on Tuesday, the 27th, with Dr. Lequita Halt.  DISPOSITION:  Maria [name], Maria [name].  CONDITION ON DISCHARGE:  Improving.     Alexzandrew L. Julien Girt, P.A.C.   ______________________________ Ollen Gross, M.D.    ALP/MEDQ  D:  12/31/2010  T:  12/31/2010  Job:  [id number]  cc:   Lanora Manis A. Asencion Islam, M.D. Fax: [phone]  Alba Destine  Electronically Signed by Patrica Duel P.A.C. on 01/08/2011 10:29:22 AM Electronically Signed by Ollen Gross M.D. on 01/11/2011 07:10:32 AM

## 2011-03-05 NOTE — Group Therapy Note (Signed)
NAME:  SHAOLIN, ARMAS NO.:  [id number]   MEDICAL RECORD NO.:  [id number]          PATIENT TYPE:  WOC   LOCATION:  [facility]                   FACILITY:  [facility]   PHYSICIAN:  Kathlyn Sacramento, M.D.   DATE OF BIRTH:  Jun 13, 1931   DATE OF SERVICE:                                    CLINIC NOTE   CHIEF COMPLAINT:  Here for a pelvic exam.   HISTORY OF PRESENT ILLNESS:  The patient is a 77 year old, postmenopausal,  multiparous female coming in for her annual pelvic exam.  She has never had  an abnormal Pap smear, and her last Pap smear was two years ago.  She has  had a breast exam this year with her primary care Yvonna Brun.   ALLERGIES:  ASPIRIN.   MEDICATIONS:  Supplements.   PHYSICAL EXAMINATION:  VITAL SIGNS: Temp 99, pulse 77, blood pressure  153/87, weight 141, height 5 feet 5 inches.  GU:  Normal external genitalia.  Vaginal mucosa atrophic with no relaxation  of the anterior vaginal wall.  Cervix is multiparous appearing.  Uterus is  small, mobile; no adnexal mass or tenderness; no cervical motion tenderness.   IMPRESSION:  Postmenopausal multiparous female.   PLAN:  1.  Pelvic exam done today.  Patient does not need any further Pap smears.      She is to continue with yearly mammograms and breast exam and pelvic      exam.  2.  Hypertension.  Patient was instructed to follow up with her primary care      Shyquan Stallbaumer regarding her elevated blood pressure.           ______________________________  Kathlyn Sacramento, M.D.     AC/MEDQ  D:  10/21/2005  T:  10/21/2005  Job:  [id number]

## 2011-03-05 NOTE — H&P (Signed)
NAME:  Maria Maria [name], Maria [name] NO.:  [id number]   MEDICAL RECORD NO.:  [id number]           PATIENT TYPE:   LOCATION:                                 FACILITY:   PHYSICIAN:  Vesta Mixer, M.D. DATE OF BIRTH:  1931/01/07   DATE OF ADMISSION:  DATE OF DISCHARGE:                                HISTORY & PHYSICAL   Maria Maria [name] Maria [name] is a 77 year old female with a history of arthritis.  She was  asked to see me today for further evaluation of some shortness of breath.   Maria Maria [name] Maria [name] has been relatively healthy.  She has had a history of arthritis.  She has never had any cardiac problems.   Starting about a month ago, she started having some generalized fatigue,  shortness of breath, and cough.  She stated that she has had progressive  dyspnea on exertion with any sort of small amounts of exertion.  She had a  viral illness a month or so ago characterized by postnasal drip and some  sneezing.  She really has just not gotten over that viral illness.  She has  had some intermittent fevers and states that she will be sweating one minute  and then will be cold the next minute.  She denies any PND or orthopnea.  She denies any syncope or presyncope.  She denies any real anginal-like  chest pain but has had shortness of breath.   CURRENT MEDICATIONS:  1.  Probiotic 2 teaspoons a day.  2.  Multivitamins once daily.  3.  Osteo Bi-Flex once daily.  4.  Fish oil capsules 2 tablets a day.  5.  Cinnamon 500 mg a day.  6.  Estrace vaginal cream three time a week.  7.  Acetaminophen as needed.  8.  Sleep aid tablets once at night.   She is sensitive to ASPIRIN in that she has had some gastritis.   PAST MEDICAL HISTORY:  History of arthritis.   SOCIAL HISTORY:  Patient does not smoke.  She drinks 1-2 glasses of wine at  night.   FAMILY HISTORY:  Both her mother and father lived to be in their 30s.   Review of systems was reviewed and is essentially negative except as noted  in the  HPI.   PHYSICAL EXAMINATION:  VITAL SIGNS:  Maria [name] is 139.  Blood pressure 130/80  with a heart rate of 80.  GENERAL:  She is an elderly female in no acute distress.  She is alert and  oriented x3, and her mood and affect are normal.  NECK:  Carotids 2+.  She has no bruits, no JVD, no thyromegaly.  LUNGS:  Clear to auscultation.  HEART:  Regular rate.  S1 and S2.  She has a two-component pericardial  friction rub.  She has a soft murmur.  ABDOMEN:  Good bowel sounds.  Nontender.  EXTREMITIES:  She has no clubbing, cyanosis or edema.  Her distal pulses are  normal.   Echocardiogram today reveals moderate left ventricular dysfunction.  Her  ejection fraction is around 30-35%.  She has trace  mitral regurgitation and  mild tricuspid regurgitation.   Maria Maria [name] Maria [name] presents with an interesting scenario.  It clearly sounds like she  had a viral illness a month or so ago, and now she has clinical evidence of  pericarditis.  In fact, she states that when she thinks about it, she may  have a little bit of chest tightness that seems to get better when she sits  forward.  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  She has a known  left bundle branch block, which she has had for years.  I have asked for Dr.  Paralee Cancel office to fax this over to me for our review.  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  I have scheduled her for a heart catheterization tomorrow  morning.  We have discussed the risks, benefits and options of heart  catheterization.  She understands and agrees to proceed.  We will go ahead  and start her on lisinopril 10 mg a day and Coreg CR 10 mg a day.  I have  asked her to try taking some Motrin for her pericardial inflammation.  If  the Motrin seems to help with the inflammation, then that would be further  confirmatory evidence that this is a viral pericarditis.  She did not have  any evidence of  pericardial effusion by echo.  I will see her back in the  office in a week or two.           ______________________________  Vesta Mixer, M.D.     PJN/MEDQ  D:  05/10/2006  T:  05/10/2006  Job:  [id number]   cc:   Silas Sacramento, M.D.  Fax: [phone]

## 2011-03-05 NOTE — Cardiovascular Report (Signed)
NAME:  Maria [name], Maria [name] NO.:  [id number]   MEDICAL RECORD NO.:  [id number]          PATIENT TYPE:  OIB   LOCATION:  1961                         FACILITY:  [facility]   PHYSICIAN:  Vesta Mixer, M.D. DATE OF BIRTH:  1931/07/15   DATE OF PROCEDURE:  05/11/2006  DATE OF DISCHARGE:  05/11/2006                              CARDIAC CATHETERIZATION   [name] is an elderly female with a history of congestive heart failure  symptoms.  She recently had a stress Cardiolite study which revealed  depressed left ventricular systolic function.  She is referred for heart  catheterization for further evaluation.   PROCEDURE:  Left heart catheterization with coronary angiography.   The right femoral artery was easily cannulated using a modified Seldinger  technique.   HEMODYNAMICS:  The LV pressure was 110/7 with an aortic pressure of 102/46.   Angiography, left main.  The left main is relatively short but is  unremarkable.  There are no stenoses.   The left anterior descending artery is a moderate-sized vessel.  It gives  off a large branching diagonal branch and then continues down the AV groove.  It is only a moderate-sized vessel at that point.  It is fairly tortuous,  which appears to be consistent with a history of hypertension.   The first diagonal vessel is a large branching vessel.  There are no  stenoses.  The diagonal vessel was quite tortuous, as well.   The left circumflex artery was a moderate-sized vessel.  It gives off a very  tiny first obtuse marginal artery and then terminates as a posterolateral  branch.  There are no significant stenosis in this branch.  The  posterolateral branch is very tortuous.   The right coronary artery is large and dominant.  It is smooth and normal  throughout its course.  It gives off a fairly good-sized posterior  descending artery.  It also gives off a very tiny posterolateral branch.  All of these branches are smooth and  normal.   The left ventriculogram was performed in a 30 RAO position.  It reveals mild  left ventricular dysfunction.  The ejection fraction reveals mild to  moderate left ventricular dysfunction.  The ejection fraction is  approximately 35-40%.  There is no significant mitral regurgitation.  Aortic  root is normal size.   COMPLICATIONS:  None.   CONCLUSIONS:  1. Smooth and normal coronary arteries.  2. Mild to moderate left ventricular dysfunction.  We will continue with      aggressive medical therapy.           ______________________________  Vesta Mixer, M.D.     PJN/MEDQ  D:  06/27/2006  T:  06/27/2006  Job:  [id number]

## 2011-08-15 IMAGING — CR DG CHEST 1V PORT
1 series · 1 of 1 positions shown · non-contrast
Comparison: 07/21/2010

CLINICAL DATA: History of hypoxia.

PORTABLE CHEST - 1 VIEW

[series 1]
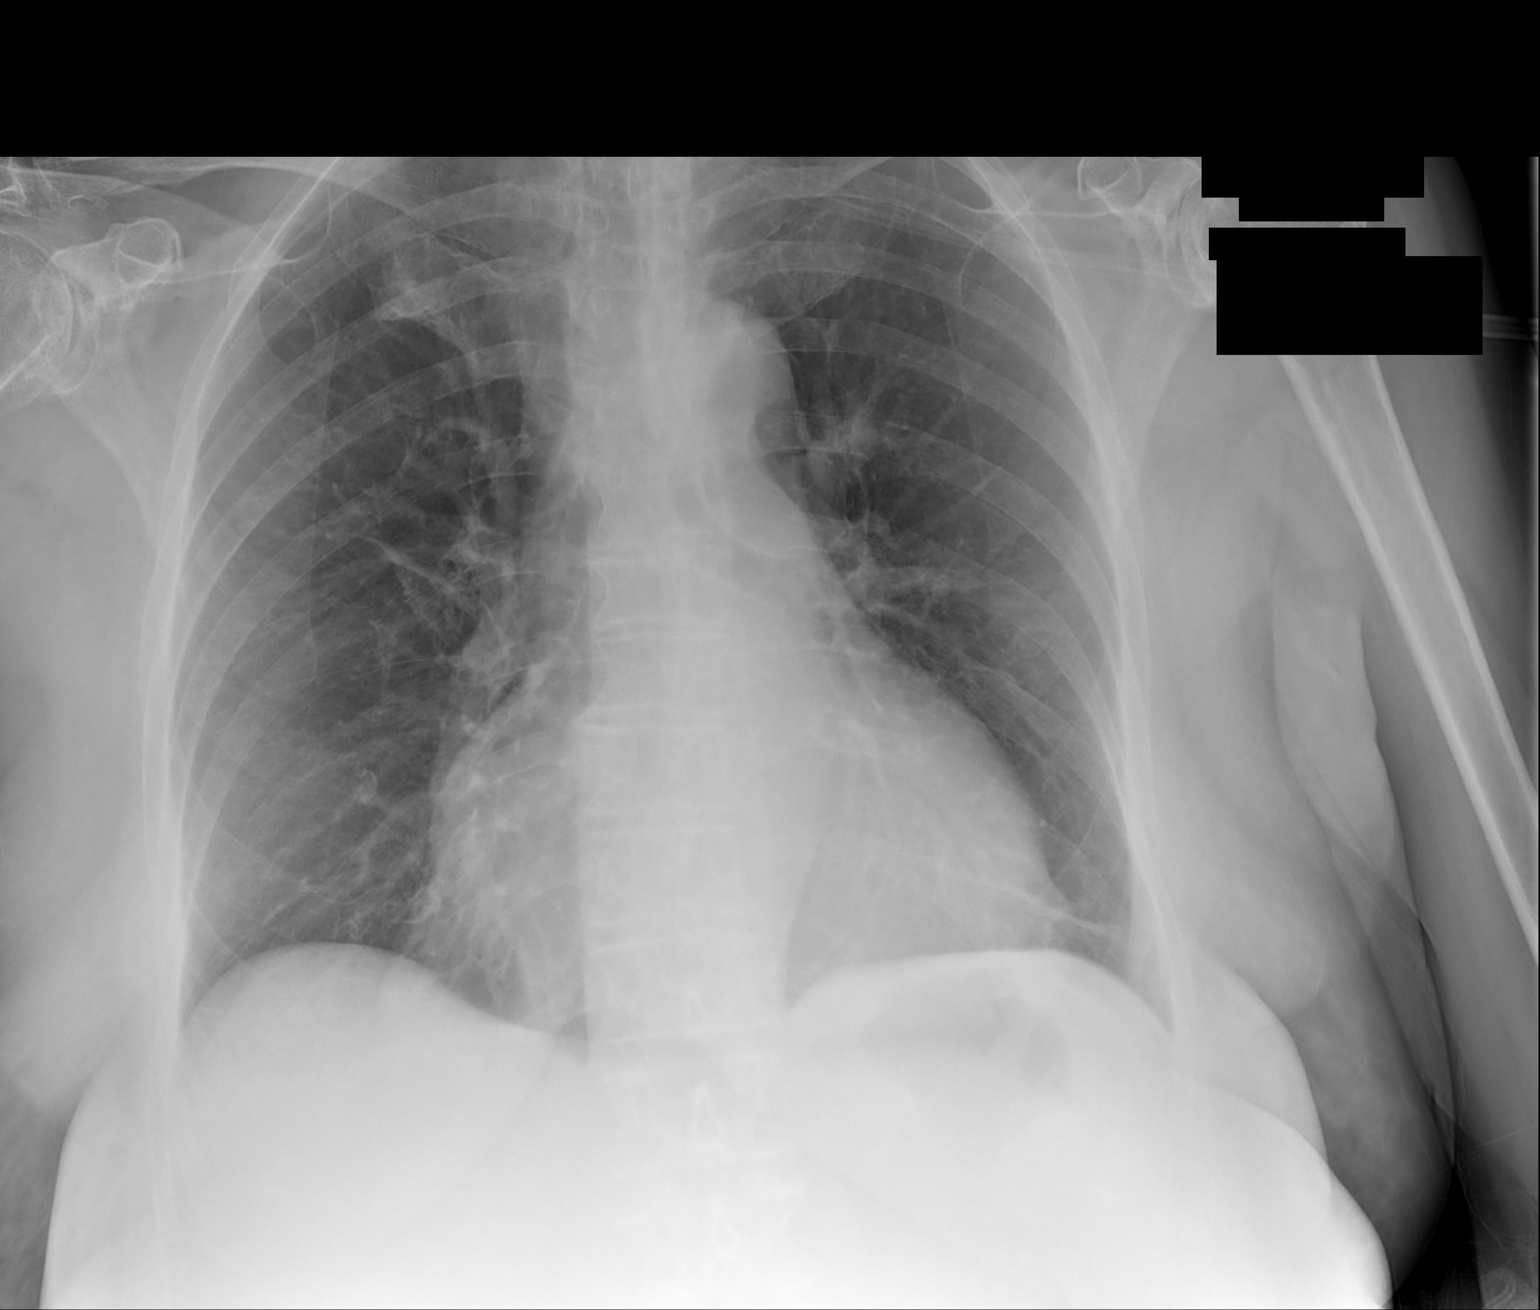

[1 of 1 positions shown; findings below may reference images not displayed]

FINDINGS: There is moderate enlargement of the cardiac silhouette.
It appears larger than on previous study but the previous study was
PA in the current study has AP magnification.  Ectasia of thoracic
aorta is seen.  No pulmonary edema, pneumonia, or pleural effusion
is seen.  No pneumothorax is evident.  Area of subsegmental
atelectasis is seen in the left base.  There is osteopenic
appearance of the bones.
IMPRESSION: Moderate enlargement of the cardiac silhouette.  Left basilar
subsegmental atelectasis.  No pulmonary edema, pneumonia, or
pleural effusion is evident.

## 2011-10-02 ENCOUNTER — Ambulatory Visit (INDEPENDENT_AMBULATORY_CARE_PROVIDER_SITE_OTHER): Payer: Medicare Other

## 2011-10-02 DIAGNOSIS — H00039 Abscess of eyelid unspecified eye, unspecified eyelid: Secondary | ICD-10-CM

## 2011-10-02 DIAGNOSIS — J343 Hypertrophy of nasal turbinates: Secondary | ICD-10-CM

## 2011-10-02 DIAGNOSIS — H00019 Hordeolum externum unspecified eye, unspecified eyelid: Secondary | ICD-10-CM

## 2012-01-26 ENCOUNTER — Ambulatory Visit (INDEPENDENT_AMBULATORY_CARE_PROVIDER_SITE_OTHER): Payer: Medicare Other | Admitting: Internal Medicine

## 2012-01-26 VITALS — BP 130/75 | HR 72 | Temp 98.2°F | Resp 16 | Ht 64.0 in | Wt 136.0 lb

## 2012-01-26 DIAGNOSIS — N949 Unspecified condition associated with female genital organs and menstrual cycle: Secondary | ICD-10-CM

## 2012-01-26 DIAGNOSIS — N898 Other specified noninflammatory disorders of vagina: Secondary | ICD-10-CM

## 2012-01-26 DIAGNOSIS — B9689 Other specified bacterial agents as the cause of diseases classified elsewhere: Secondary | ICD-10-CM

## 2012-01-26 DIAGNOSIS — B373 Candidiasis of vulva and vagina: Secondary | ICD-10-CM

## 2012-01-26 LAB — POCT WET PREP WITH KOH
KOH Prep POC: NEGATIVE
Trichomonas, UA: NEGATIVE

## 2012-01-26 MED ORDER — METRONIDAZOLE 250 MG PO TABS: 250.0000 mg | ORAL_TABLET | Freq: Three times a day (TID) | ORAL | Status: AC

## 2012-01-26 MED ORDER — FLUCONAZOLE 150 MG PO TABS: 150.0000 mg | ORAL_TABLET | Freq: Every day | ORAL | Status: AC

## 2012-01-26 NOTE — Progress Notes (Signed)
  Subjective:    Patient ID: Maria [name], female    DOB: 1931-09-03, 78 y.o.   MRN: [id number]  HPI Vaginal discharge onset 3 days ago creamy colored moderate in severity and amount. Some swelling and redness to the labia esp on the left . Throbbing pelvic pain moderate in severity 5/10, no radiation. No fever, nausea or vomiting. No recent antibiotics, not sexually active. Recent colonoscopy   Review of Systems  Constitutional: Negative.   HENT: Negative.   Eyes: Negative.   Respiratory: Negative.   Cardiovascular: Negative.   Gastrointestinal: Negative.   Genitourinary: Positive for vaginal discharge and pelvic pain. Negative for dysuria, frequency and difficulty urinating.       Urinary incontinence  Musculoskeletal: Negative.   Neurological: Negative.   Hematological: Negative.   Psychiatric/Behavioral: Negative.   All other systems reviewed and are negative.       Objective:   Physical Exam  Constitutional: She is oriented to person, place, and time. She appears well-developed and well-nourished.  HENT:  Head: Normocephalic and atraumatic.  Eyes: Conjunctivae are normal. Pupils are equal, round, and reactive to light.  Neck: Normal range of motion. Neck supple.  Cardiovascular: Normal rate and regular rhythm.   Pulmonary/Chest: Effort normal and breath sounds normal.  Abdominal: Soft. Bowel sounds are normal. She exhibits no distension and no mass. There is no tenderness. There is no rebound and no guarding.  Genitourinary:       Diffuse erythema to the labia and anal area and into hte introitus with yellow discharge thick profuse from the vagina. The cervix is firm and friable with bleeding on exam. Uterus is nontender and not enlarged.  Musculoskeletal: Normal range of motion.  Neurological: She is alert and oriented to person, place, and time.  Skin: Skin is warm and dry. There is erythema.  Psychiatric: She has a normal mood and affect. Her behavior is normal. Judgment  and thought content normal.      Wet prep    Assessment & Plan:  Bacterial vaginosis  Fungal dermatitis Plan diflucan and flagyl as directed Return one week after treatment for repeat exam to re evaluate cervix and for pap smear.

## 2012-01-26 NOTE — Patient Instructions (Signed)
Take meds as directed. Return in one week for re check

## 2012-03-08 ENCOUNTER — Ambulatory Visit (INDEPENDENT_AMBULATORY_CARE_PROVIDER_SITE_OTHER): Payer: Medicare Other | Admitting: Family Medicine

## 2012-03-08 VITALS — BP 126/82 | HR 78 | Temp 98.5°F | Resp 16 | Ht 65.0 in | Wt 139.0 lb

## 2012-03-08 DIAGNOSIS — B373 Candidiasis of vulva and vagina: Secondary | ICD-10-CM

## 2012-03-08 DIAGNOSIS — B3731 Acute candidiasis of vulva and vagina: Secondary | ICD-10-CM

## 2012-03-08 DIAGNOSIS — R300 Dysuria: Secondary | ICD-10-CM

## 2012-03-08 DIAGNOSIS — N898 Other specified noninflammatory disorders of vagina: Secondary | ICD-10-CM

## 2012-03-08 LAB — POCT URINALYSIS DIPSTICK
Bilirubin, UA: NEGATIVE
Leukocytes, UA: NEGATIVE
Nitrite, UA: NEGATIVE
Protein, UA: NEGATIVE
pH, UA: 7

## 2012-03-08 LAB — POCT WET PREP WITH KOH: Clue Cells Wet Prep HPF POC: NEGATIVE

## 2012-03-08 LAB — POCT UA - MICROSCOPIC ONLY
Casts, Ur, LPF, POC: NEGATIVE
WBC, Ur, HPF, POC: NEGATIVE
Yeast, UA: NEGATIVE

## 2012-03-08 MED ORDER — FLUCONAZOLE 150 MG PO TABS: 150.0000 mg | ORAL_TABLET | Freq: Once | ORAL | Status: AC

## 2012-03-08 NOTE — Progress Notes (Signed)
Patient Name: Maria [name] Date of Birth: 1930/11/09 Medical Record Number: [id number] Gender: female Date of Encounter: 03/08/2012  History of Present Illness:  Maria [name] is a 78 y.o. very pleasant female patient who presents with the following:  See last visit per Dr. Mindi Junker- she was treated for BV and fungal dermatitis of the vagina- she was treated with flagyl and diflucan. She was noted to have diffuse irritation and a friable cervix, so she was asked to return for a pap smear.  She does feel better, but continues to notice some "brown" discharge on herpoise pad at times.  She has not noted any bright red bleeding vaginally.    She does have a history of abnormal pap about 25 years ago.  She did not have to have any additional treatments but she was watched carefully with paps for some time.  Last pap was about 5 years ago.  It was normal.    She does note a little dysuria at times.    There is no problem list on file for this patient.  No past medical history on file. No past surgical history on file. History  Substance Use Topics  . Smoking status: Never Smoker   . Smokeless tobacco: Not on file  . Alcohol Use: Not on file   No family history on file. Allergies  Allergen Reactions  . Aspirin Other (See Comments)    Gastritis and stomach ulcers    Medication list has been reviewed and updated.  Review of Systems: As per HPI- otherwise negative.   Physical Examination: Filed Vitals:   03/08/12 1647  BP: 126/82  Pulse: 78  Temp: 98.5 F (36.9 C)  TempSrc: Oral  Resp: 16  Height: 5\' 5"  (1.651 m)  Weight: 139 lb (63.05 kg)  SpO2: 97%    Body mass index is 23.13 kg/(m^2).  GEN: WDWN, NAD, Non-toxic, A & O x 3 HEENT: Atraumatic, Normocephalic. Neck supple. No masses, No LAD. Ears and Nose: No external deformity. CV: RRR, No M/G/R. No JVD. No thrill. No extra heart sounds. PULM: CTA B, no wheezes, crackles, rhonchi. No retractions. No resp. distress. No  accessory muscle use. ABD: S, NT, ND, +BS. No rebound. No HSM. EXTR: No c/c/e NEURO Normal gait.  PSYCH: Normally interactive. Conversant. Not depressed or anxious appearing.  Calm demeanor.  Gu: external genitalia is still inflamed, but better than descritption of last visit.  Vaginal canal is normal- there is no discharge and the cervix is NOT friable at this point  Results for orders placed in visit on 03/08/12  POCT URINALYSIS DIPSTICK      Component Value Range   Color, UA yellow     Clarity, UA clear     Glucose, UA neg     Bilirubin, UA neg     Ketones, UA neg     Spec Grav, UA 1.010     Blood, UA trace-intact     pH, UA 7.0     Protein, UA neg     Urobilinogen, UA 0.2     Nitrite, UA neg     Leukocytes, UA Negative    POCT UA - MICROSCOPIC ONLY      Component Value Range   WBC, Ur, HPF, POC neg     RBC, urine, microscopic neg     Bacteria, U Microscopic neg     Mucus, UA neg     Epithelial cells, urine per micros neg     Crystals, Ur, HPF,  POC neg     Casts, Ur, LPF, POC neg     Yeast, UA neg    POCT WET PREP WITH KOH      Component Value Range   Trichomonas, UA Negative     Clue Cells Wet Prep HPF POC neg     Epithelial Wet Prep HPF POC 0-1     Yeast Wet Prep HPF POC neg     Bacteria Wet Prep HPF POC 1+     RBC Wet Prep HPF POC neg     WBC Wet Prep HPF POC 0-1     KOH Prep POC Negative      Assessment and Plan: 1. Vaginal Discharge  POCT Wet Prep with KOH, Pap IG w/ reflex to HPV when ASC-U, fluconazole (DIFLUCAN) 150 MG tablet  2. Dysuria  POCT urinalysis dipstick, POCT UA - Microscopic Only   Suspect some persistent yeast, await pap results.  Treat with diflucan weekly for the next 1 or 2 weeks. Will plan further follow- up pending labs.

## 2012-03-15 LAB — PAP IG W/ RFLX HPV ASCU

## 2012-03-17 ENCOUNTER — Encounter: Payer: Self-pay | Admitting: Family Medicine

## 2012-06-14 ENCOUNTER — Ambulatory Visit: Payer: Medicare Other

## 2012-06-14 ENCOUNTER — Ambulatory Visit (INDEPENDENT_AMBULATORY_CARE_PROVIDER_SITE_OTHER): Payer: Medicare Other | Admitting: Family Medicine

## 2012-06-14 VITALS — BP 128/82 | HR 80 | Temp 98.2°F | Resp 17 | Ht 65.5 in | Wt 139.0 lb

## 2012-06-14 DIAGNOSIS — M25539 Pain in unspecified wrist: Secondary | ICD-10-CM

## 2012-06-14 NOTE — Progress Notes (Signed)
[facility] [address] [place] [address] [phone]  Date:  06/14/2012   Name:  Maria [name]   DOB:  01-08-1931   MRN:  [id number]  PCP:  Tally Due, MD    Chief Complaint: Wrist Injury   History of Present Illness:  Maria [name] is a 78 y.o. very pleasant female patient who presents with the following:  Here today to evaluate a wrist injury- she fell onto her right wrist about 2 weeks ago- the distal radius and ulna continue to hurt- she has pain with flexion and extension of the wrist, and cannot carry a full coffee cup.  No other injuries, otherwise she is ok.  She has been wearing a velcro wrist splint at night, which does help  There is no problem list on file for this patient.   No past medical history on file.  No past surgical history on file.  History  Substance Use Topics  . Smoking status: Never Smoker   . Smokeless tobacco: Not on file  . Alcohol Use: Not on file    No family history on file.  Allergies  Allergen Reactions  . Aspirin Other (See Comments)    Gastritis and stomach ulcers    Medication list has been reviewed and updated.  Current Outpatient Prescriptions on File Prior to Visit  Medication Sig Dispense Refill  . acetaminophen (TYLENOL) 650 MG CR tablet Take 650 mg by mouth every 8 (eight) hours as needed.      Marland Kitchen acyclovir ointment (ZOVIRAX) 5 % Apply 1 application topically as needed.      . carvedilol (COREG) 3.125 MG tablet Take 3.125 mg by mouth 2 (two) times daily with a meal.      . celecoxib (CELEBREX) 200 MG capsule Take 200 mg by mouth as needed.      . latanoprost (XALATAN) 0.005 % ophthalmic solution Place 1 drop into both eyes every morning.      Marland Kitchen lisinopril (PRINIVIL,ZESTRIL) 5 MG tablet Take 5 mg by mouth daily.      Marland Kitchen thyroid (ARMOUR) 30 MG tablet Take 30 mg by mouth every morning.      . venlafaxine (EFFEXOR) 25 MG tablet Take 25 mg by mouth daily.      . Omega-3 Fatty Acids (FISH OIL)  1200 MG CAPS Take 2,000 mg by mouth daily.        Review of Systems:  As per HPI- otherwise negative.   Physical Examination: Filed Vitals:   06/14/12 1046  BP: 128/82  Pulse: 80  Temp: 98.2 F (36.8 C)  Resp: 17   Filed Vitals:   06/14/12 1046  Height: 5' 5.5" (1.664 m)  Weight: 139 lb (63.05 kg)   Body mass index is 22.78 kg/(m^2). Ideal Body Weight: Weight in (lb) to have BMI = 25: 152.2   GEN: WDWN, NAD, Non-toxic, A & O x 3 HEENT: Atraumatic, Normocephalic. Neck supple. No masses, No LAD. Ears and Nose: No external deformity. CV: RRR, No M/G/R. No JVD. No thrill. No extra heart sounds. PULM: CTA B, no wheezes, crackles, rhonchi. No retractions. No resp. distress. No accessory muscle use. EXTR: No c/c/e.  Right wrist is tender and minimally swollen. Tender to flexion and extension of wrist, but not with radial or ulnar deviation.  Normal hand. Normal grip- no pain with grip.   NEURO Normal gait.  PSYCH: Normally interactive. Conversant. Not depressed or anxious appearing.  Calm demeanor.   [facility]  reading (PRIMARY) by  Dr. Patsy Lager.  Right wrist: no definite fracture.  Possible old ulnar styloid injury.    RIGHT WRIST - COMPLETE 3+ VIEW  Comparison: None.  Findings: No distal radius or ulnar fracture. Radiocarpal joint is intact. Remote ulnar styloid fracture. No carpal fracture.  IMPRESSION: No acute wrist fracture   Assessment and Plan: 1. Pain, wrist  DG Wrist Complete Right   Wrist pain 2 weeks after a fall- suspicious for occult fracture.  Placed in custom thumb spica splint today- she will wear this for a week for protection.  Ok to apply ice over the splint.  Recheck in a week.    Called and [facility]- radiologist also Doyle as negative.  Call or RTC if symptoms persist after a week- I would wear splint for a week as planned.    Abbe Amsterdam, MD

## 2013-07-24 ENCOUNTER — Ambulatory Visit: Payer: Medicare Other | Attending: Otolaryngology | Admitting: Speech Pathology

## 2013-07-24 DIAGNOSIS — R490 Dysphonia: Secondary | ICD-10-CM | POA: Insufficient documentation

## 2013-07-24 DIAGNOSIS — IMO0001 Reserved for inherently not codable concepts without codable children: Secondary | ICD-10-CM | POA: Insufficient documentation

## 2013-08-03 ENCOUNTER — Ambulatory Visit: Payer: Medicare Other

## 2013-08-07 ENCOUNTER — Ambulatory Visit: Payer: Medicare Other

## 2013-08-17 ENCOUNTER — Ambulatory Visit: Payer: Medicare Other

## 2013-08-23 ENCOUNTER — Ambulatory Visit (INDEPENDENT_AMBULATORY_CARE_PROVIDER_SITE_OTHER): Payer: Medicare Other | Admitting: Family Medicine

## 2013-08-23 VITALS — BP 110/60 | HR 66 | Temp 98.2°F | Resp 16 | Ht 64.5 in | Wt 135.2 lb

## 2013-08-23 DIAGNOSIS — B3741 Candidal cystitis and urethritis: Secondary | ICD-10-CM

## 2013-08-23 DIAGNOSIS — N390 Urinary tract infection, site not specified: Secondary | ICD-10-CM

## 2013-08-23 DIAGNOSIS — B373 Candidiasis of vulva and vagina: Secondary | ICD-10-CM

## 2013-08-23 DIAGNOSIS — R350 Frequency of micturition: Secondary | ICD-10-CM

## 2013-08-23 DIAGNOSIS — R300 Dysuria: Secondary | ICD-10-CM

## 2013-08-23 LAB — POCT URINALYSIS DIPSTICK
Ketones, UA: NEGATIVE
Protein, UA: NEGATIVE
Spec Grav, UA: 1.02
pH, UA: 5.5

## 2013-08-23 LAB — POCT UA - MICROSCOPIC ONLY: Crystals, Ur, HPF, POC: NEGATIVE

## 2013-08-23 MED ORDER — CIPROFLOXACIN HCL 500 MG PO TABS: 500.0000 mg | ORAL_TABLET | Freq: Two times a day (BID) | ORAL | Status: DC

## 2013-08-23 MED ORDER — FLUCONAZOLE 150 MG PO TABS: 150.0000 mg | ORAL_TABLET | Freq: Once | ORAL | Status: DC

## 2013-08-23 NOTE — Progress Notes (Signed)
Subjective:    Patient ID: Maria [name], female    DOB: 03/22/1931, 79 y.o.   MRN: [id number] Chief Complaint  Patient presents with  . Burning with urination    X 2 weeks  . Increased urination frequency    X 2 weeks    HPI  Sxs started 2 wks ago along with a herpes outbreak. Had been in to see the doctor the day prior and forgot to discuss. Told to start on folic acid and then started on acyclovir and it got rid of the gentical herpes outbreak.  Cleansed outside of vulva with folic acid which seem to help.  But for the past 2-3d wakes with bladder pain and urinary urgency. Has been having incontinence and dysuria.  Has been swabbing with folic acid again which helps.  Has chronic back pain unchanged.  No f/c.  Have to run to the restroom every 1-2 hrs. No abdominal pain, no n/v.   Past Medical History  Diagnosis Date  . Arthritis   . CHF (congestive heart failure)   . Glaucoma    Current Outpatient Prescriptions on File Prior to Visit  Medication Sig Dispense Refill  . acetaminophen (TYLENOL) 650 MG CR tablet Take 650 mg by mouth every 8 (eight) hours as needed.      Marland Kitchen acyclovir ointment (ZOVIRAX) 5 % Apply 1 application topically as needed.      . carvedilol (COREG) 3.125 MG tablet Take 3.125 mg by mouth 2 (two) times daily with a meal.      . latanoprost (XALATAN) 0.005 % ophthalmic solution Place 1 drop into both eyes every morning.      Marland Kitchen lisinopril (PRINIVIL,ZESTRIL) 5 MG tablet Take 5 mg by mouth daily.      Marland Kitchen thyroid (ARMOUR) 30 MG tablet Take 30 mg by mouth every morning.      . celecoxib (CELEBREX) 200 MG capsule Take 200 mg by mouth as needed.      . Omega-3 Fatty Acids (FISH OIL) 1200 MG CAPS Take 2,000 mg by mouth daily.      Marland Kitchen venlafaxine (EFFEXOR) 25 MG tablet Take 25 mg by mouth daily.       No current facility-administered medications on file prior to visit.   Allergies  Allergen Reactions  . Aspirin Other (See Comments)    Gastritis and stomach ulcers     Review of Systems  Constitutional: Negative for fever, chills, diaphoresis, activity change, appetite change, fatigue and unexpected weight change.  Gastrointestinal: Negative for nausea, vomiting, abdominal pain, diarrhea, constipation, blood in stool, abdominal distention, anal bleeding and rectal pain.  Genitourinary: Positive for dysuria, urgency, frequency, enuresis, genital sores and pelvic pain. Negative for hematuria, decreased urine volume, vaginal bleeding, vaginal discharge, difficulty urinating, vaginal pain, menstrual problem and dyspareunia.  Musculoskeletal: Positive for back pain. Negative for gait problem.  Skin: Negative for rash.  Hematological: Negative for adenopathy.  Psychiatric/Behavioral: The patient is not nervous/anxious.       BP 110/60  Pulse 66  Temp(Src) 98.2 F (36.8 C) (Oral)  Resp 16  Ht 5' 4.5" (1.638 m)  Wt 135 lb 3.2 oz (61.326 kg)  BMI 22.86 kg/m2  SpO2 97% Objective:   Physical Exam  Constitutional: She is oriented to person, place, and time. She appears well-developed and well-nourished. No distress.  HENT:  Head: Normocephalic and atraumatic.  Right Ear: External ear normal.  Left Ear: External ear normal.  Eyes: Conjunctivae are normal. No scleral icterus.  Neck: Normal range  of motion. Neck supple. No thyromegaly present.  Cardiovascular: Normal rate, regular rhythm, normal heart sounds and intact distal pulses.   Pulmonary/Chest: Effort normal and breath sounds normal. No respiratory distress.  Abdominal: Normal appearance and bowel sounds are normal. There is no tenderness. There is no CVA tenderness.  Musculoskeletal: She exhibits no edema.  Lymphadenopathy:    She has no cervical adenopathy.  Neurological: She is alert and oriented to person, place, and time.  Skin: Skin is warm and dry. She is not diaphoretic. No erythema.  Psychiatric: She has a normal mood and affect. Her behavior is normal.          Results for orders  placed in visit on 08/23/13  POCT UA - MICROSCOPIC ONLY      Result Value Range   WBC, Ur, HPF, POC tntc     RBC, urine, microscopic 0-8     Bacteria, U Microscopic 3+     Mucus, UA trace     Epithelial cells, urine per micros 0-4     Crystals, Ur, HPF, POC neg     Casts, Ur, LPF, POC neg     Yeast, UA pos    POCT URINALYSIS DIPSTICK      Result Value Range   Color, UA yellow     Clarity, UA clear     Glucose, UA neg     Bilirubin, UA neg     Ketones, UA neg     Spec Grav, UA 1.020     Blood, UA trace-lysed     pH, UA 5.5     Protein, UA neg     Urobilinogen, UA 0.2     Nitrite, UA neg     Leukocytes, UA large (3+)      Assessment & Plan:  Burning with urination - Plan: POCT UA - Microscopic Only, POCT urinalysis dipstick  Increased frequency of urination - Plan: POCT UA - Microscopic Only, POCT urinalysis dipstick  UTI (urinary tract infection) - Plan: Urine culture  Yeast cystitis  Meds ordered this encounter  Medications  . ciprofloxacin (CIPRO) 500 MG tablet    Sig: Take 1 tablet (500 mg total) by mouth 2 (two) times daily.    Dispense:  6 tablet    Refill:  0  . fluconazole (DIFLUCAN) 150 MG tablet    Sig: Take 1 tablet (150 mg total) by mouth once.    Dispense:  1 tablet    Refill:  0   Norberto Sorenson, MD MPH

## 2013-08-24 ENCOUNTER — Ambulatory Visit: Payer: Medicare Other

## 2013-08-29 MED ORDER — FLUCONAZOLE 150 MG PO TABS: 150.0000 mg | ORAL_TABLET | Freq: Once | ORAL | Status: DC

## 2013-08-29 NOTE — Addendum Note (Signed)
Addended by: Norberto Sorenson on: 08/29/2013 09:31 AM   Modules accepted: Orders

## 2013-08-31 ENCOUNTER — Ambulatory Visit: Payer: Medicare Other | Attending: Otolaryngology

## 2013-08-31 DIAGNOSIS — R490 Dysphonia: Secondary | ICD-10-CM | POA: Insufficient documentation

## 2013-08-31 DIAGNOSIS — IMO0001 Reserved for inherently not codable concepts without codable children: Secondary | ICD-10-CM | POA: Insufficient documentation

## 2013-12-31 ENCOUNTER — Ambulatory Visit (INDEPENDENT_AMBULATORY_CARE_PROVIDER_SITE_OTHER): Payer: Medicare Other | Admitting: Family Medicine

## 2013-12-31 VITALS — BP 118/62 | HR 79 | Temp 98.7°F | Resp 16 | Ht 65.0 in | Wt 138.4 lb

## 2013-12-31 DIAGNOSIS — R300 Dysuria: Secondary | ICD-10-CM

## 2013-12-31 DIAGNOSIS — R350 Frequency of micturition: Secondary | ICD-10-CM

## 2013-12-31 LAB — POCT URINALYSIS DIPSTICK
Bilirubin, UA: NEGATIVE
Glucose, UA: NEGATIVE
Ketones, UA: NEGATIVE
Nitrite, UA: NEGATIVE
Protein, UA: NEGATIVE
Spec Grav, UA: 1.01
Urobilinogen, UA: 0.2
pH, UA: 5.5

## 2013-12-31 LAB — POCT WET PREP WITH KOH
Bacteria Wet Prep HPF POC: NEGATIVE
CLUE CELLS WET PREP PER HPF POC: NEGATIVE
Epithelial Wet Prep HPF POC: NEGATIVE
KOH Prep POC: NEGATIVE
RBC Wet Prep HPF POC: NEGATIVE
TRICHOMONAS UA: NEGATIVE
WBC Wet Prep HPF POC: NEGATIVE
YEAST WET PREP PER HPF POC: NEGATIVE

## 2013-12-31 LAB — POCT UA - MICROSCOPIC ONLY
Casts, Ur, LPF, POC: NEGATIVE
Crystals, Ur, HPF, POC: NEGATIVE
Epithelial cells, urine per micros: NEGATIVE
Mucus, UA: NEGATIVE
Yeast, UA: NEGATIVE

## 2013-12-31 MED ORDER — CEPHALEXIN 500 MG PO CAPS: 500.0000 mg | ORAL_CAPSULE | Freq: Two times a day (BID) | ORAL | Status: DC

## 2013-12-31 NOTE — Patient Instructions (Signed)
I will let you know the results of your urine culture.  For now take the keflex antibiotic twice a day for one week.  Let me know if you are getting worse.

## 2013-12-31 NOTE — Progress Notes (Addendum)
[facility] [address], [place] [address] [phone]  Date:  12/31/2013   Name:  Maria [name]   DOB:  07-25-1931   MRN:  [id number]  PCP:  Kennon Portela, MD    Chief Complaint: Burning with urination and Increased frequency of urination   History of Present Illness:  Maria [name] is a 80 y.o. very pleasant female patient who presents with the following:  She has noted UTI type sx for about one week; she notes frquennt urination.  She has been drinking a lot of cranberry juice to try and treat herself at home.  She had a UTI last summer, and again in November of this year.  She is not sure if it is normal for her to have such frequent UTI.  Generally she has not had frequent UTI.  She is undergoing some dental work right now and has used some amoxicillin recently.    No fever, no nausea or vomiting.  She had pain in her urethra this am- otherwise no abdominal pain.   She has noted some mild vaginal discharge recently her pantyliner will seem damp.  No itching.    There are no active problems to display for this patient.   Past Medical History  Diagnosis Date  . Arthritis   . CHF (congestive heart failure)   . Glaucoma     Past Surgical History  Procedure Laterality Date  . Appendectomy    . Joint replacement    . Tubal ligation      History  Substance Use Topics  . Smoking status: Never Smoker   . Smokeless tobacco: Not on file  . Alcohol Use: Not on file    Family History  Problem Relation Age of Onset  . Heart disease Mother   . Cancer Sister   . Cancer Brother   . Cancer Sister     Allergies  Allergen Reactions  . Aspirin Other (See Comments)    Gastritis and stomach ulcers    Medication list has been reviewed and updated.  Current Outpatient Prescriptions on File Prior to Visit  Medication Sig Dispense Refill  . acetaminophen (TYLENOL) 650 MG CR tablet Take 650 mg by mouth every 8 (eight) hours as needed.      Marland Kitchen  acyclovir ointment (ZOVIRAX) 5 % Apply 1 application topically as needed.      . carvedilol (COREG) 3.125 MG tablet Take 3.125 mg by mouth 2 (two) times daily with a meal.      . latanoprost (XALATAN) 0.005 % ophthalmic solution Place 1 drop into both eyes every morning.      Marland Kitchen lisinopril (PRINIVIL,ZESTRIL) 5 MG tablet Take 5 mg by mouth daily.      . Omega-3 Fatty Acids (FISH OIL) 1200 MG CAPS Take 2,000 mg by mouth daily.      Marland Kitchen thyroid (ARMOUR) 30 MG tablet Take 30 mg by mouth every morning.      . celecoxib (CELEBREX) 200 MG capsule Take 200 mg by mouth as needed.      . ciprofloxacin (CIPRO) 500 MG tablet Take 1 tablet (500 mg total) by mouth 2 (two) times daily.  6 tablet  0  . fluconazole (DIFLUCAN) 150 MG tablet Take 1 tablet (150 mg total) by mouth once.  1 tablet  0  . venlafaxine (EFFEXOR) 25 MG tablet Take 25 mg by mouth daily.       No current facility-administered medications on file prior to  visit.    Review of Systems:  As per HPI- otherwise negative.   Physical Examination: Filed Vitals:   12/31/13 1354  BP: 118/62  Pulse: 79  Temp: 98.7 F (37.1 C)  Resp: 16   Filed Vitals:   12/31/13 1354  Height: 5\' 5"  (1.651 m)  Weight: 138 lb 6.4 oz (62.778 kg)   Body mass index is 23.03 kg/(m^2). Ideal Body Weight: Weight in (lb) to have BMI = 25: 149.9  GEN: WDWN, NAD, Non-toxic, A & O x 3, looks well.  Alert and spry HEENT: Atraumatic, Normocephalic. Neck supple. No masses, No LAD. Ears and Nose: No external deformity. CV: RRR, No M/G/R. No JVD. No thrill. No extra heart sounds. PULM: CTA B, no wheezes, crackles, rhonchi. No retractions. No resp. distress. No accessory muscle use. ABD: S, NT, ND, +BS. No rebound. No HSM.  No CVA tenderness EXTR: No c/c/e NEURO Normal gait for age- does use a cane PSYCH: Normally interactive. Conversant. Not depressed or anxious appearing.  Calm demeanor.  Gu: normal exam for age.  Some vaginal dryness and atrophy but no  abnormality.     Results for orders placed in visit on 12/31/13  POCT UA - MICROSCOPIC ONLY      Result Value Ref Range   WBC, Ur, HPF, POC 2-7     RBC, urine, microscopic 0-2     Bacteria, U Microscopic trace     Mucus, UA negative     Epithelial cells, urine per micros negative     Crystals, Ur, HPF, POC negative     Casts, Ur, LPF, POC negative     Yeast, UA negative    POCT URINALYSIS DIPSTICK      Result Value Ref Range   Color, UA yellow     Clarity, UA clear     Glucose, UA negative     Bilirubin, UA negative     Ketones, UA negative     Spec Grav, UA 1.010     Blood, UA trace-lysed     pH, UA 5.5     Protein, UA negative     Urobilinogen, UA 0.2     Nitrite, UA negative     Leukocytes, UA Trace    POCT WET PREP WITH KOH      Result Value Ref Range   Trichomonas, UA Negative     Clue Cells Wet Prep HPF POC neg     Epithelial Wet Prep HPF POC neg     Yeast Wet Prep HPF POC neg     Bacteria Wet Prep HPF POC neg     RBC Wet Prep HPF POC neg     WBC Wet Prep HPF POC neg     KOH Prep POC Negative      Assessment and Plan: Increased frequency of urination - Plan: POCT UA - Microscopic Only, POCT urinalysis dipstick, cephALEXin (KEFLEX) 500 MG capsule, Urine culture  Burning with urination - Plan: POCT UA - Microscopic Only, POCT urinalysis dipstick, POCT Wet Prep with KOH  Possible recurrent UTI.  Will treat with keflex 500 BID for one week  Follow-up pending culture.   Reassured that 3 UTI in a year is not very out of the ordinary.  However if this continues to be a problem will refer to urology Signed Lamar Blinks, MD  3/18: gave her a call.  She is feeling better.  She did have a mildly positive urine culture, keflex should clear this up.  She will  let us know if any other problems.

## 2014-01-01 LAB — URINE CULTURE

## 2014-10-31 ENCOUNTER — Ambulatory Visit: Payer: Self-pay | Admitting: Orthopedic Surgery

## 2014-10-31 NOTE — Progress Notes (Signed)
Preoperative surgical orders have been place into the Epic hospital system for Maria [name] on 10/31/2014, 12:14 PM  by Mickel Crow for surgery on 11-29-2014.  Preop Total Knee orders and a knee cortisone injection including Experal, IV Tylenol, and IV Decadron as long as there are no contraindications to the above medications. Arlee Muslim, PA-C

## 2014-11-19 ENCOUNTER — Other Ambulatory Visit (HOSPITAL_COMMUNITY): Payer: Self-pay | Admitting: *Deleted

## 2014-11-19 NOTE — Patient Instructions (Addendum)
Maria [name]  11/19/2014   Your procedure is scheduled on: 11/29/14   Report to [facility] Main  Entrance and follow signs to               Maria [name] at 5:15 AM.   Call this number if you have problems the morning of surgery [phone]   Remember:  Do not eat food or drink liquids :After Midnight.     Take these medicines the morning of surgery with A SIP OF WATER: CARVEDILOL / ARMOUR THYROID / Maria [name] / USE EYE DROPS AND NASAL SPRAY              BRING EYE DROPS TO HOSPITAL                                You may not have any metal on your body including hair pins and              piercings  Do not wear jewelry, make-up, lotions, powders or perfumes.             Do not wear nail polish.  Do not shave  48 hours prior to surgery.              Men may shave face and neck.   Do not bring valuables to the hospital. Maria [name].  Contacts, dentures or bridgework may not be worn into surgery.  Leave suitcase in the car. After surgery it may be brought to your room.     Patients discharged the day of surgery will not be allowed to drive home.  Name and phone number of your driver:  Special Instructions: N/A              Please Dosh over the following fact sheets you were given: _____________________________________________________________________                                                     Maria [name]  Before surgery, you can play an important role.  Because skin is not sterile, your skin needs to be as free of germs as possible.  You can reduce the number of germs on your skin by washing with CHG (chlorahexidine gluconate) soap before surgery.  CHG is an antiseptic cleaner which kills germs and bonds with the skin to continue killing germs even after washing. Please DO NOT use if you have an allergy to CHG or antibacterial soaps.  If your skin becomes reddened/irritated stop  using the CHG and inform your nurse when you arrive at Short Stay. Do not shave (including legs and underarms) for at least 48 hours prior to the first CHG shower.  You may shave your face. Please follow these instructions carefully:   1.  Shower with CHG Soap the night before surgery and the  morning of Surgery.   2.  If you choose to wash your hair, wash your hair first as usual with your  normal  Shampoo.   3.  After you shampoo, rinse your hair and body thoroughly to  remove the  shampoo.                                         4.  Use CHG as you would any other liquid soap.  You can apply chg directly  to the skin and wash . Gently wash with scrungie or clean wascloth    5.  Apply the CHG Soap to your body ONLY FROM THE NECK DOWN.   Do not use on open                           Wound or open sores. Avoid contact with eyes, ears mouth and genitals (private parts).                        Genitals (private parts) with your normal soap.              6.  Wash thoroughly, paying special attention to the area where your surgery  will be performed.   7.  Thoroughly rinse your body with warm water from the neck down.   8.  DO NOT shower/wash with your normal soap after using and rinsing off  the CHG Soap .                9.  Pat yourself dry with a clean towel.             10.  Wear clean pajamas.             11.  Place clean sheets on your bed the night of your first shower and do not  sleep with pets.  Day of Surgery : Do not apply any lotions/deodorants the morning of surgery.  Please wear clean clothes to the hospital/surgery center.  FAILURE TO FOLLOW THESE INSTRUCTIONS MAY RESULT IN THE CANCELLATION OF YOUR SURGERY    PATIENT SIGNATURE_________________________________  ______________________________________________________________________     Maria [name]  An incentive spirometer is a tool that can help keep your lungs clear and active. This tool measures how well  you are filling your lungs with each breath. Taking long deep breaths may help reverse or decrease the chance of developing breathing (pulmonary) problems (especially infection) following:  A long period of time when you are unable to move or be active. BEFORE THE PROCEDURE   If the spirometer includes an indicator to show your best effort, your nurse or respiratory therapist will set it to a desired goal.  If possible, sit up straight or lean slightly forward. Try not to slouch.  Hold the incentive spirometer in an upright position. INSTRUCTIONS FOR USE   Sit on the edge of your bed if possible, or sit up as far as you can in bed or on a chair.  Hold the incentive spirometer in an upright position.  Breathe out normally.  Place the mouthpiece in your mouth and seal your lips tightly around it.  Breathe in slowly and as deeply as possible, raising the piston or the ball toward the top of the column.  Hold your breath for 3-5 seconds or for as long as possible. Allow the piston or ball to fall to the bottom of the column.  Remove the mouthpiece from your mouth and breathe out normally.  Rest for a few seconds and repeat Steps 1 through 7 at  least 10 times every 1-2 hours when you are awake. Take your time and take a few normal breaths between deep breaths.  The spirometer may include an indicator to show your best effort. Use the indicator as a goal to work toward during each repetition.  After each set of 10 deep breaths, practice coughing to be sure your lungs are clear. If you have an incision (the cut made at the time of surgery), support your incision when coughing by placing a pillow or rolled up towels firmly against it. Once you are able to get out of bed, walk around indoors and cough well. You may stop using the incentive spirometer when instructed by your caregiver.  RISKS AND COMPLICATIONS  Take your time so you do not get dizzy or light-headed.  If you are in pain, you  may need to take or ask for pain medication before doing incentive spirometry. It is harder to take a deep breath if you are having pain. AFTER USE  Rest and breathe slowly and easily.  It can be helpful to keep track of a log of your progress. Your caregiver can provide you with a simple table to help with this. If you are using the spirometer at home, follow these instructions: [place] IF:   You are having difficultly using the spirometer.  You have trouble using the spirometer as often as instructed.  Your pain medication is not giving enough relief while using the spirometer.  You develop fever of 100.5 F (38.1 C) or higher. SEEK IMMEDIATE MEDICAL CARE IF:   You cough up bloody sputum that had not been present before.  You develop fever of 102 F (38.9 C) or greater.  You develop worsening pain at or near the incision site. MAKE SURE YOU:   Understand these instructions.  Will watch your condition.  Will get help right away if you are not doing well or get worse. Document Released: 02/14/2007 Document Revised: 12/27/2011 Document Reviewed: 04/17/2007 ExitCare Patient Information 2014 ExitCare, [place].   ________________________________________________________________________  WHAT IS A BLOOD TRANSFUSION? Blood Transfusion Information  A transfusion is the replacement of blood or some of its parts. Blood is made up of multiple cells which provide different functions.  Red blood cells carry oxygen and are used for blood loss replacement.  White blood cells fight against infection.  Platelets control bleeding.  Plasma helps clot blood.  Other blood products are available for specialized needs, such as hemophilia or other clotting disorders. BEFORE THE TRANSFUSION  Who gives blood for transfusions?   Healthy volunteers who are fully evaluated to make sure their blood is safe. This is blood bank blood. Transfusion therapy is the safest it has ever been  in the practice of medicine. Before blood is taken from a donor, a complete history is taken to make sure that person has no history of diseases nor engages in risky social behavior (examples are intravenous drug use or sexual activity with multiple partners). The donor's travel history is screened to minimize risk of transmitting infections, such as malaria. The donated blood is tested for signs of infectious diseases, such as HIV and hepatitis. The blood is then tested to be sure it is compatible with you in order to minimize the chance of a transfusion reaction. If you or a relative donates blood, this is often done in anticipation of surgery and is not appropriate for emergency situations. It takes many days to process the donated blood. RISKS AND COMPLICATIONS Although transfusion  therapy is very safe and saves many lives, the main dangers of transfusion include:   Getting an infectious disease.  Developing a transfusion reaction. This is an allergic reaction to something in the blood you were given. Every precaution is taken to prevent this. The decision to have a blood transfusion has been considered carefully by your caregiver before blood is given. Blood is not given unless the benefits outweigh the risks. AFTER THE TRANSFUSION  Right after receiving a blood transfusion, you will usually feel much better and more energetic. This is especially true if your red blood cells have gotten low (anemic). The transfusion raises the level of the red blood cells which carry oxygen, and this usually causes an energy increase.  The nurse administering the transfusion will monitor you carefully for complications. HOME CARE INSTRUCTIONS  No special instructions are needed after a transfusion. You may find your energy is better. Speak with your caregiver about any limitations on activity for underlying diseases you may have. SEEK MEDICAL CARE IF:   Your condition is not improving after your  transfusion.  You develop redness or irritation at the intravenous (IV) site. SEEK IMMEDIATE MEDICAL CARE IF:  Any of the following symptoms occur over the next 12 hours:  Shaking chills.  You have a temperature by mouth above 102 F (38.9 C), not controlled by medicine.  Chest, back, or muscle pain.  People around you feel you are not acting correctly or are confused.  Shortness of breath or difficulty breathing.  Dizziness and fainting.  You get a rash or develop hives.  You have a decrease in urine output.  Your urine turns a dark color or changes to pink, red, or brown. Any of the following symptoms occur over the next 10 days:  You have a temperature by mouth above 102 F (38.9 C), not controlled by medicine.  Shortness of breath.  Weakness after normal activity.  The white part of the eye turns yellow (jaundice).  You have a decrease in the amount of urine or are urinating less often.  Your urine turns a dark color or changes to pink, red, or brown. Document Released: 10/01/2000 Document Revised: 12/27/2011 Document Reviewed: 05/20/2008 [facility] Patient Information 2014 [place], [place].  _______________________________________________________________________

## 2014-11-21 ENCOUNTER — Encounter (HOSPITAL_COMMUNITY): Payer: Self-pay

## 2014-11-21 ENCOUNTER — Encounter (HOSPITAL_COMMUNITY)
Admission: RE | Admit: 2014-11-21 | Discharge: 2014-11-21 | Disposition: A | Discharge status: Home / Self Care | Payer: Medicare Other | Source: Ambulatory Visit | Attending: Orthopedic Surgery | Admitting: Orthopedic Surgery

## 2014-11-21 DIAGNOSIS — M179 Osteoarthritis of knee, unspecified: Secondary | ICD-10-CM | POA: Insufficient documentation

## 2014-11-21 DIAGNOSIS — Z01818 Encounter for other preprocedural examination: Secondary | ICD-10-CM | POA: Diagnosis not present

## 2014-11-21 HISTORY — DX: Personal history of peptic ulcer disease: Z87.11

## 2014-11-21 HISTORY — DX: Personal history of urinary (tract) infections: Z87.440

## 2014-11-21 HISTORY — DX: Personal history of other diseases of the digestive system: Z87.19

## 2014-11-21 HISTORY — DX: Unspecified urinary incontinence: R32

## 2014-11-21 HISTORY — DX: Anxiety disorder, unspecified: F41.9

## 2014-11-21 HISTORY — DX: Sleep disorder, unspecified: G47.9

## 2014-11-21 HISTORY — DX: Age-related osteoporosis without current pathological fracture: M81.0

## 2014-11-21 HISTORY — DX: Hypothyroidism, unspecified: E03.9

## 2014-11-21 HISTORY — DX: Nontoxic single thyroid nodule: E04.1

## 2014-11-21 HISTORY — DX: Postnasal drip: R09.82

## 2014-11-21 LAB — COMPREHENSIVE METABOLIC PANEL
ALK PHOS: 83 U/L (ref 39–117)
ALT: 19 U/L (ref 0–35)
ANION GAP: 7 (ref 5–15)
AST: 21 U/L (ref 0–37)
Albumin: 4.1 g/dL (ref 3.5–5.2)
BILIRUBIN TOTAL: 0.9 mg/dL (ref 0.3–1.2)
BUN: 22 mg/dL (ref 6–23)
CALCIUM: 9.3 mg/dL (ref 8.4–10.5)
CO2: 31 mmol/L (ref 19–32)
Chloride: 103 mmol/L (ref 96–112)
Creatinine, Ser: 1 mg/dL (ref 0.50–1.10)
GFR calc Af Amer: 60 mL/min — ABNORMAL LOW (ref >90)
GFR calc non Af Amer: 51 mL/min — ABNORMAL LOW (ref >90)
GLUCOSE: 101 mg/dL — AB (ref 70–99)
Potassium: 4.1 mmol/L (ref 3.5–5.1)
Sodium: 141 mmol/L (ref 135–145)
Total Protein: 6.8 g/dL (ref 6.0–8.3)

## 2014-11-21 LAB — CBC
HEMATOCRIT: 46.9 % — AB (ref 36.0–46.0)
Hemoglobin: 15.8 g/dL — ABNORMAL HIGH (ref 12.0–15.0)
MCH: 32.2 pg (ref 26.0–34.0)
MCHC: 33.7 g/dL (ref 30.0–36.0)
MCV: 95.7 fL (ref 78.0–100.0)
Platelets: 227 10*3/uL (ref 150–400)
RBC: 4.9 MIL/uL (ref 3.87–5.11)
RDW: 13.4 % (ref 11.5–15.5)
WBC: 7.6 10*3/uL (ref 4.0–10.5)

## 2014-11-21 LAB — URINALYSIS, ROUTINE W REFLEX MICROSCOPIC
Bilirubin Urine: NEGATIVE
GLUCOSE, UA: NEGATIVE mg/dL
HGB URINE DIPSTICK: NEGATIVE
Ketones, ur: NEGATIVE mg/dL
NITRITE: NEGATIVE
PROTEIN: NEGATIVE mg/dL
Specific Gravity, Urine: 1.017 (ref 1.005–1.030)
UROBILINOGEN UA: 0.2 mg/dL (ref 0.0–1.0)
pH: 6 (ref 5.0–8.0)

## 2014-11-21 LAB — PROTIME-INR
INR: 0.99 (ref 0.00–1.49)
Prothrombin Time: 13.2 seconds (ref 11.6–15.2)

## 2014-11-21 LAB — URINE MICROSCOPIC-ADD ON

## 2014-11-21 LAB — SURGICAL PCR SCREEN
MRSA, PCR: NEGATIVE
Staphylococcus aureus: NEGATIVE

## 2014-11-21 LAB — APTT: aPTT: 30 seconds (ref 24–37)

## 2014-11-21 NOTE — Progress Notes (Signed)
U/A with micro results done 11/21/14 faxed via EPIC to Dr Wynelle Link.

## 2014-11-26 ENCOUNTER — Ambulatory Visit: Payer: Self-pay | Admitting: Orthopedic Surgery

## 2014-11-26 NOTE — H&P (Signed)
Maria [name] DOB: 1931-04-06 Single / Language: Cleophus Molt / Race: White Female Date of Admission:  11/29/2014 CC: Left knee pain History of Present Illness The patient is a 81 year old female who comes in for a preoperative History and Physical. The patient is scheduled for a left total knee arthroplasty to be performed by Dr. Dione Plover. Aluisio, MD at [facility] on 11-29-2014. The patient is a 81 year old female who presents today for follow up of their knee. The patient is being followed for their bilateral knee pain and osteoarthritis. Symptoms reported today include: pain (but not all the time), aching, giving way, instability and difficulty ambulating. The patient feels that they are doing poorly and report their pain level to be moderate. The following medication has been used for pain control: Tylenol (as needed). Unfortunately, her left knee is getting progressively worse. The knee is giving out on her frequently. She has documented endstage arthritis of the knee. She says from a pain standpoint she can tolerate it. The problem is she cannot trust the knee and she has fallen and hurt herself. She is ready to proceed with the total knee arthroplasty. They have been treated conservatively in the past for the above stated problem and despite conservative measures, they continue to have progressive pain and severe functional limitations and dysfunction. They have failed non-operative management including home exercise, medications, and injections. It is felt that they would benefit from undergoing total joint replacement. Risks and benefits of the procedure have been discussed with the patient and they elect to proceed with surgery. There are no active contraindications to surgery such as ongoing infection or rapidly progressive neurological disease.   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  Allergies  Aspir-81 *ANALGESICS - NonNarcotic* sensativity  Family History Heart disease in female family member before age 52 Heart Disease grandfather fathers side Hypertension mother Cancer First Degree Relatives. sister and brother Osteoarthritis mother, father, sister, brother, grandmother fathers side and grandfather fathers side  Social History  Drug/Alcohol Rehab (Currently) no Alcohol use current drinker; drinks wine; 5-7 per week Living situation live alone Exercise Exercises daily; does other Illicit drug use no Children 2 Tobacco use Never smoker. 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 [id number] (09/01/2010) - [facility] [address]/fva/lmf) Ibuprofen (200MG  Tablet, Oral) Active. Mood Formula (Oral) Active. (OTC Mood Elevator) Fish Oil Burp-Less (Oral) Specific dose unknown - Active. Xanax (0.25MG  Tablet, Oral) Active. Atrovent HFA (17MCG/ACT Aerosol Soln, Inhalation) Active. Vitamin D (Oral) Specific dose unknown - Active. Cranberry Fruit (Oral) Specific dose unknown - Active. Lisinopril (5MG  Tablet, Oral) Active. Armour Thyroid (30MG  Tablet, Oral) Active. Latanoprost  (0.005% Solution, Ophthalmic) Active. Tylenol (325MG  Tablet, 1 Oral) Active. Carvedilol (3.125MG  Tablet, Oral) Active.  Past Surgical History  Colon Polyp Removal - Open Colon  Polyp Removal - Colonoscopy Tonsillectomy Tubal Ligation Total Hip Replacement bilateral Breast Biopsy left Appendectomy S/p Right THA  Review of Systems General Not Present- Chills, Fatigue, Fever, Memory Loss, Night Sweats, Weight Gain and Weight Loss. Skin Not Present- Eczema, Hives, Itching, Lesions and Rash. HEENT Not Present- Dentures, Double Vision, Headache, Hearing Loss, Tinnitus and Visual Loss. Respiratory Not Present- Allergies, Chronic Cough, Coughing up blood, Shortness of breath at rest and Shortness of breath with exertion. Cardiovascular Not Present- Chest Pain, Difficulty Breathing Lying Down, Murmur, Palpitations, Racing/skipping heartbeats and Swelling. Gastrointestinal Not Present- Abdominal Pain, Bloody Stool, Constipation, Diarrhea, Difficulty Swallowing, Heartburn, Jaundice, Loss of appetitie, Nausea and Vomiting. Female Genitourinary Present- Urinary frequency and Urinating at Night. Not Present- Blood in Urine, Discharge, Flank Pain, Incontinence, Painful Urination, Urgency, Urinary Retention and Weak urinary stream. Musculoskeletal Present- Joint Pain, Muscle Pain and Muscle Weakness. Not Present- Back Pain, Joint Swelling, Morning Stiffness and Spasms. Neurological Not Present- Blackout spells, Difficulty with balance, Dizziness, Paralysis, Tremor and Weakness. Psychiatric Not Present- Insomnia.  Vitals  Weight: 144 lb Height: 65in Height was reported by patient. Body Surface Area: 1.72 m Body Mass Index: 23.96 kg/m  BP: 130/82 (Sitting, Left Arm, Standard) Regular rhythm with occassional ectopic beat   Physical Exam  General Mental Status -Alert, cooperative and good historian. General Appearance-pleasant, Not in acute distress. Orientation-Oriented  X3. Build & Nutrition-Well nourished and Well developed.  Head and Neck Head-normocephalic, atraumatic . Neck Global Assessment - supple, no bruit auscultated on the right, no bruit auscultated on the left.  Eye Pupil - Bilateral-Regular and Round. Motion - Bilateral-EOMI.  Chest and Lung Exam Auscultation Breath sounds - clear at anterior chest wall and clear at posterior chest wall. Adventitious sounds - No Adventitious sounds.  Cardiovascular Auscultation Rhythm - Regular rate and rhythm(occassional ectopic beat auscultated on exam.). Heart Sounds - S1 WNL and S2 WNL. Murmurs & Other Heart Sounds - Auscultation of the heart reveals - No Murmurs.  Abdomen Palpation/Percussion Tenderness - Abdomen is non-tender to palpation. Rigidity (guarding) - Abdomen is soft. Auscultation Auscultation of the abdomen reveals - Bowel sounds normal.  Female Genitourinary Note: Not done, not pertinent to present illness   Musculoskeletal Note: On exam she is alert and oriented in no apparent distress. Her left knee shows a valgus deformity. Range of motion is about 5 to 120. There is marked crepitus on range of motion. There is no instability noted.  RADIOGRAPHS: Reviewed her radiographs. She has bone on bone arthritis all three compartments, worse laterally with valgus noted.   Assessment & Plan Primary osteoarthritis of left knee (M17.12) Note:Plan is for a Left Total Knee Replacement by Dr. Wynelle Link.  Plan is to go to rehab.  PCP - Dr. Roque Cash  The patient does not have any contraindications and will receive TXA (tranexamic acid) prior to surgery.  Signed electronically by Joelene Millin, III PA-C

## 2014-11-28 NOTE — Anesthesia Preprocedure Evaluation (Addendum)
Anesthesia Evaluation  Patient identified by MRN, date of birth, ID band Patient awake    Reviewed: Allergy & Precautions, NPO status , Patient's Chart, lab work & pertinent test results  Airway Mallampati: II  TM Distance: >3 FB Neck ROM: Full    Dental no notable dental hx.    Pulmonary neg pulmonary ROS,  breath sounds clear to auscultation  Pulmonary exam normal       Cardiovascular hypertension, Pt. on home beta blockers and Pt. on medications +CHF Rhythm:Regular Rate:Normal     Neuro/Psych PSYCHIATRIC DISORDERS Anxiety negative neurological ROS     GI/Hepatic negative GI ROS, Neg liver ROS,   Endo/Other  Hypothyroidism   Renal/GU negative Renal ROS     Musculoskeletal  (+) Arthritis -,   Abdominal   Peds  Hematology negative hematology ROS (+)   Anesthesia Other Findings   Reproductive/Obstetrics negative OB ROS                            Anesthesia Physical Anesthesia Plan  ASA: III  Anesthesia Plan: Spinal   Post-op Pain Management: MAC Combined w/ Regional for Post-op pain   Induction:   Airway Management Planned:   Additional Equipment:   Intra-op Plan:   Post-operative Plan:   Informed Consent: I have reviewed the patients History and Physical, chart, labs and discussed the procedure including the risks, benefits and alternatives for the proposed anesthesia with the patient or authorized representative who has indicated his/her understanding and acceptance.   Dental advisory given  Plan Discussed with: CRNA  Anesthesia Plan Comments:        Anesthesia Quick Evaluation

## 2014-11-29 ENCOUNTER — Encounter (HOSPITAL_COMMUNITY): Payer: Self-pay | Admitting: *Deleted

## 2014-11-29 ENCOUNTER — Inpatient Hospital Stay (HOSPITAL_COMMUNITY)
Admission: RE | Admit: 2014-11-29 | Discharge: 2014-12-02 | DRG: 470 | Disposition: A | Discharge status: Skilled Nursing Facility | Payer: Medicare Other | Source: Ambulatory Visit | Attending: Orthopedic Surgery | Admitting: Orthopedic Surgery

## 2014-11-29 ENCOUNTER — Inpatient Hospital Stay (HOSPITAL_COMMUNITY): Payer: Medicare Other | Admitting: Anesthesiology

## 2014-11-29 ENCOUNTER — Encounter (HOSPITAL_COMMUNITY)
Admission: RE | Disposition: A | Discharge status: Skilled Nursing Facility | Payer: Self-pay | Source: Ambulatory Visit | Attending: Orthopedic Surgery

## 2014-11-29 DIAGNOSIS — F419 Anxiety disorder, unspecified: Secondary | ICD-10-CM | POA: Diagnosis present

## 2014-11-29 DIAGNOSIS — Z79899 Other long term (current) drug therapy: Secondary | ICD-10-CM | POA: Diagnosis not present

## 2014-11-29 DIAGNOSIS — M810 Age-related osteoporosis without current pathological fracture: Secondary | ICD-10-CM | POA: Diagnosis present

## 2014-11-29 DIAGNOSIS — Z01812 Encounter for preprocedural laboratory examination: Secondary | ICD-10-CM

## 2014-11-29 DIAGNOSIS — M1712 Unilateral primary osteoarthritis, left knee: Secondary | ICD-10-CM

## 2014-11-29 DIAGNOSIS — M179 Osteoarthritis of knee, unspecified: Secondary | ICD-10-CM | POA: Diagnosis present

## 2014-11-29 DIAGNOSIS — Z96642 Presence of left artificial hip joint: Secondary | ICD-10-CM | POA: Diagnosis present

## 2014-11-29 DIAGNOSIS — M170 Bilateral primary osteoarthritis of knee: Secondary | ICD-10-CM | POA: Diagnosis present

## 2014-11-29 DIAGNOSIS — E039 Hypothyroidism, unspecified: Secondary | ICD-10-CM | POA: Diagnosis present

## 2014-11-29 DIAGNOSIS — M25562 Pain in left knee: Secondary | ICD-10-CM | POA: Diagnosis present

## 2014-11-29 DIAGNOSIS — M1710 Unilateral primary osteoarthritis, unspecified knee: Secondary | ICD-10-CM | POA: Diagnosis present

## 2014-11-29 HISTORY — PX: TOTAL KNEE ARTHROPLASTY: SHX125

## 2014-11-29 LAB — TYPE AND SCREEN
ABO/RH(D): O POS
Antibody Screen: NEGATIVE

## 2014-11-29 SURGERY — ARTHROPLASTY, KNEE, TOTAL
Anesthesia: Spinal | Laterality: Left

## 2014-11-29 MED ORDER — EPHEDRINE SULFATE 50 MG/ML IJ SOLN
INTRAMUSCULAR | Status: AC
  Filled 2014-11-29: qty 1

## 2014-11-29 MED ORDER — PREDNISOLONE ACETATE 1 % OP SUSP
1.0000 [drp] | Freq: Every day | OPHTHALMIC | Status: DC
  Filled 2014-11-29: qty 1

## 2014-11-29 MED ORDER — PROMETHAZINE HCL 25 MG/ML IJ SOLN
6.2500 mg | INTRAMUSCULAR | Status: DC | PRN
Start: 2014-11-29 — End: 2014-11-29

## 2014-11-29 MED ORDER — BUPIVACAINE LIPOSOME 1.3 % IJ SUSP
20.0000 mL | Freq: Once | INTRAMUSCULAR | Status: DC
  Filled 2014-11-29: qty 20

## 2014-11-29 MED ORDER — OXYCODONE HCL 5 MG PO TABS: 5.0000 mg | ORAL_TABLET | Freq: Once | ORAL | Status: DC | PRN

## 2014-11-29 MED ORDER — OXYCODONE HCL 5 MG PO TABS
5.0000 mg | ORAL_TABLET | ORAL | Status: DC | PRN
  Administered 2014-11-29: 5 mg via ORAL
  Administered 2014-11-29: 10 mg via ORAL
  Administered 2014-11-29: 5 mg via ORAL
  Administered 2014-11-29 – 2014-12-01 (×12): 10 mg via ORAL
  Administered 2014-12-02 (×2): 5 mg via ORAL
  Administered 2014-12-02: 10 mg via ORAL
  Filled 2014-11-29 (×11): qty 2
  Filled 2014-11-29: qty 1
  Filled 2014-11-29 (×3): qty 2
  Filled 2014-11-29 (×3): qty 1

## 2014-11-29 MED ORDER — METHYLPREDNISOLONE ACETATE 80 MG/ML IJ SUSP
INTRAMUSCULAR | Status: DC | PRN
  Administered 2014-11-29: 80 mg

## 2014-11-29 MED ORDER — METHOCARBAMOL 1000 MG/10ML IJ SOLN
500.0000 mg | Freq: Four times a day (QID) | INTRAVENOUS | Status: DC | PRN
  Filled 2014-11-29: qty 5

## 2014-11-29 MED ORDER — EPHEDRINE SULFATE 50 MG/ML IJ SOLN
INTRAMUSCULAR | Status: DC | PRN
  Administered 2014-11-29 (×3): 5 mg via INTRAVENOUS

## 2014-11-29 MED ORDER — LIDOCAINE HCL (CARDIAC) 20 MG/ML IV SOLN
INTRAVENOUS | Status: AC
  Filled 2014-11-29: qty 5

## 2014-11-29 MED ORDER — SODIUM CHLORIDE 0.9 % IJ SOLN
INTRAMUSCULAR | Status: AC
  Filled 2014-11-29: qty 10

## 2014-11-29 MED ORDER — METOCLOPRAMIDE HCL 5 MG/ML IJ SOLN: 5.0000 mg | Freq: Three times a day (TID) | INTRAMUSCULAR | Status: DC | PRN

## 2014-11-29 MED ORDER — KETOROLAC TROMETHAMINE 30 MG/ML IJ SOLN: 15.0000 mg | Freq: Once | INTRAMUSCULAR | Status: DC | PRN

## 2014-11-29 MED ORDER — BISACODYL 10 MG RE SUPP
10.0000 mg | Freq: Every day | RECTAL | Status: DC | PRN
  Administered 2014-12-02: 10 mg via RECTAL
  Filled 2014-11-29: qty 1

## 2014-11-29 MED ORDER — POLYETHYLENE GLYCOL 3350 17 G PO PACK: 17.0000 g | PACK | Freq: Every day | ORAL | Status: DC | PRN

## 2014-11-29 MED ORDER — LIDOCAINE HCL (CARDIAC) 20 MG/ML IV SOLN
INTRAVENOUS | Status: DC | PRN
Start: 2014-11-29 — End: 2014-11-29
  Administered 2014-11-29: 50 mg via INTRAVENOUS

## 2014-11-29 MED ORDER — PHENOL 1.4 % MT LIQD
1.0000 | OROMUCOSAL | Status: DC | PRN
  Filled 2014-11-29: qty 177

## 2014-11-29 MED ORDER — BUPIVACAINE LIPOSOME 1.3 % IJ SUSP
INTRAMUSCULAR | Status: DC | PRN
Start: 2014-11-29 — End: 2014-11-29
  Administered 2014-11-29: 20 mL

## 2014-11-29 MED ORDER — MIDAZOLAM HCL 2 MG/2ML IJ SOLN
INTRAMUSCULAR | Status: AC
  Filled 2014-11-29: qty 2

## 2014-11-29 MED ORDER — MEPERIDINE HCL 50 MG/ML IJ SOLN: 6.2500 mg | INTRAMUSCULAR | Status: DC | PRN

## 2014-11-29 MED ORDER — ACETAMINOPHEN 650 MG RE SUPP: 650.0000 mg | Freq: Four times a day (QID) | RECTAL | Status: DC | PRN

## 2014-11-29 MED ORDER — PREDNISOLONE ACETATE 1 % OP SUSP
1.0000 [drp] | Freq: Two times a day (BID) | OPHTHALMIC | Status: DC
  Administered 2014-11-30 – 2014-12-02 (×4): 1 [drp] via OPHTHALMIC
  Filled 2014-11-29: qty 1

## 2014-11-29 MED ORDER — ROCURONIUM BROMIDE 100 MG/10ML IV SOLN
INTRAVENOUS | Status: AC
  Filled 2014-11-29: qty 1

## 2014-11-29 MED ORDER — SODIUM CHLORIDE 0.9 % IV SOLN
INTRAVENOUS | Status: DC
  Administered 2014-11-29: 75 mL/h via INTRAVENOUS
  Administered 2014-11-30: 01:00:00 via INTRAVENOUS

## 2014-11-29 MED ORDER — FENTANYL CITRATE 0.05 MG/ML IJ SOLN
INTRAMUSCULAR | Status: AC
  Filled 2014-11-29: qty 5

## 2014-11-29 MED ORDER — DOCUSATE SODIUM 100 MG PO CAPS
100.0000 mg | ORAL_CAPSULE | Freq: Two times a day (BID) | ORAL | Status: DC
  Administered 2014-11-29 – 2014-12-02 (×7): 100 mg via ORAL

## 2014-11-29 MED ORDER — PROPOFOL INFUSION 10 MG/ML OPTIME
INTRAVENOUS | Status: DC | PRN
  Administered 2014-11-29: 25 ug/kg/min via INTRAVENOUS

## 2014-11-29 MED ORDER — HYDROMORPHONE HCL 1 MG/ML IJ SOLN: 0.2500 mg | INTRAMUSCULAR | Status: DC | PRN

## 2014-11-29 MED ORDER — CEFOTETAN DISODIUM-DEXTROSE 2-2.08 GM-% IV SOLR
INTRAVENOUS | Status: AC
  Filled 2014-11-29: qty 50

## 2014-11-29 MED ORDER — BUPIVACAINE HCL (PF) 0.25 % IJ SOLN
INTRAMUSCULAR | Status: AC
  Filled 2014-11-29: qty 30

## 2014-11-29 MED ORDER — CEFAZOLIN SODIUM-DEXTROSE 2-3 GM-% IV SOLR
2.0000 g | Freq: Four times a day (QID) | INTRAVENOUS | Status: AC
  Administered 2014-11-29 (×2): 2 g via INTRAVENOUS
  Filled 2014-11-29 (×2): qty 50

## 2014-11-29 MED ORDER — CARVEDILOL 3.125 MG PO TABS
3.1250 mg | ORAL_TABLET | Freq: Two times a day (BID) | ORAL | Status: DC
  Administered 2014-11-29 – 2014-12-02 (×6): 3.125 mg via ORAL
  Filled 2014-11-29 (×8): qty 1

## 2014-11-29 MED ORDER — FENTANYL CITRATE 0.05 MG/ML IJ SOLN
INTRAMUSCULAR | Status: DC | PRN
  Administered 2014-11-29: 25 ug via INTRAVENOUS
  Administered 2014-11-29: 50 ug via INTRAVENOUS
  Administered 2014-11-29: 25 ug via INTRAVENOUS

## 2014-11-29 MED ORDER — ACETAMINOPHEN 325 MG PO TABS: 650.0000 mg | ORAL_TABLET | Freq: Four times a day (QID) | ORAL | Status: DC | PRN

## 2014-11-29 MED ORDER — ALPRAZOLAM 0.25 MG PO TABS
0.2500 mg | ORAL_TABLET | Freq: Three times a day (TID) | ORAL | Status: DC
  Administered 2014-11-29 – 2014-12-02 (×9): 0.25 mg via ORAL
  Filled 2014-11-29 (×9): qty 1

## 2014-11-29 MED ORDER — DEXAMETHASONE SODIUM PHOSPHATE 10 MG/ML IJ SOLN
10.0000 mg | Freq: Once | INTRAMUSCULAR | Status: AC
  Administered 2014-11-29: 10 mg via INTRAVENOUS

## 2014-11-29 MED ORDER — FENTANYL CITRATE 0.05 MG/ML IJ SOLN
INTRAMUSCULAR | Status: AC
  Filled 2014-11-29: qty 2

## 2014-11-29 MED ORDER — PROPOFOL 10 MG/ML IV BOLUS
INTRAVENOUS | Status: AC
  Filled 2014-11-29: qty 20

## 2014-11-29 MED ORDER — ONDANSETRON HCL 4 MG/2ML IJ SOLN: 4.0000 mg | Freq: Four times a day (QID) | INTRAMUSCULAR | Status: DC | PRN

## 2014-11-29 MED ORDER — KETOROLAC TROMETHAMINE 30 MG/ML IJ SOLN: 30.0000 mg | Freq: Once | INTRAMUSCULAR | Status: DC | PRN

## 2014-11-29 MED ORDER — 0.9 % SODIUM CHLORIDE (POUR BTL) OPTIME
TOPICAL | Status: DC | PRN
  Administered 2014-11-29: 1000 mL

## 2014-11-29 MED ORDER — ACETAMINOPHEN 500 MG PO TABS
1000.0000 mg | ORAL_TABLET | Freq: Four times a day (QID) | ORAL | Status: AC
  Administered 2014-11-29 – 2014-11-30 (×4): 1000 mg via ORAL
  Filled 2014-11-29 (×5): qty 2

## 2014-11-29 MED ORDER — TRANEXAMIC ACID 100 MG/ML IV SOLN
1000.0000 mg | INTRAVENOUS | Status: AC
  Administered 2014-11-29: 1000 mg via INTRAVENOUS
  Filled 2014-11-29: qty 10

## 2014-11-29 MED ORDER — SODIUM CHLORIDE 0.9 % IV SOLN: INTRAVENOUS | Status: DC

## 2014-11-29 MED ORDER — ACETAMINOPHEN 10 MG/ML IV SOLN
1000.0000 mg | Freq: Once | INTRAVENOUS | Status: AC
  Administered 2014-11-29: 1000 mg via INTRAVENOUS
  Filled 2014-11-29: qty 100

## 2014-11-29 MED ORDER — TRAMADOL HCL 50 MG PO TABS
50.0000 mg | ORAL_TABLET | Freq: Four times a day (QID) | ORAL | Status: DC | PRN
Start: 2014-11-29 — End: 2014-12-02

## 2014-11-29 MED ORDER — MORPHINE SULFATE 2 MG/ML IJ SOLN: 1.0000 mg | INTRAMUSCULAR | Status: DC | PRN

## 2014-11-29 MED ORDER — RIVAROXABAN 10 MG PO TABS
10.0000 mg | ORAL_TABLET | Freq: Every day | ORAL | Status: DC
  Administered 2014-11-30 – 2014-12-02 (×3): 10 mg via ORAL
  Filled 2014-11-29 (×4): qty 1

## 2014-11-29 MED ORDER — DIPHENHYDRAMINE HCL 12.5 MG/5ML PO ELIX: 12.5000 mg | ORAL_SOLUTION | ORAL | Status: DC | PRN

## 2014-11-29 MED ORDER — BUPIVACAINE HCL 0.25 % IJ SOLN
INTRAMUSCULAR | Status: DC | PRN
  Administered 2014-11-29: 20 mL

## 2014-11-29 MED ORDER — LACTATED RINGERS IV SOLN
INTRAVENOUS | Status: DC | PRN
  Administered 2014-11-29 (×2): via INTRAVENOUS

## 2014-11-29 MED ORDER — METHYLPREDNISOLONE ACETATE 40 MG/ML IJ SUSP
INTRAMUSCULAR | Status: AC
  Filled 2014-11-29: qty 2

## 2014-11-29 MED ORDER — CEFAZOLIN SODIUM-DEXTROSE 2-3 GM-% IV SOLR
2.0000 g | INTRAVENOUS | Status: AC
  Administered 2014-11-29: 2 g via INTRAVENOUS
  Filled 2014-11-29: qty 50

## 2014-11-29 MED ORDER — FLEET ENEMA 7-19 GM/118ML RE ENEM: 1.0000 | ENEMA | Freq: Once | RECTAL | Status: AC | PRN

## 2014-11-29 MED ORDER — THYROID 30 MG PO TABS
30.0000 mg | ORAL_TABLET | Freq: Every day | ORAL | Status: DC
  Administered 2014-11-30 – 2014-12-02 (×3): 30 mg via ORAL
  Filled 2014-11-29 (×3): qty 1

## 2014-11-29 MED ORDER — CHLORHEXIDINE GLUCONATE 4 % EX LIQD: 60.0000 mL | Freq: Once | CUTANEOUS | Status: DC

## 2014-11-29 MED ORDER — METOCLOPRAMIDE HCL 10 MG PO TABS: 5.0000 mg | ORAL_TABLET | Freq: Three times a day (TID) | ORAL | Status: DC | PRN

## 2014-11-29 MED ORDER — LATANOPROST 0.005 % OP SOLN
1.0000 [drp] | Freq: Every day | OPHTHALMIC | Status: DC
  Administered 2014-11-30: 1 [drp] via OPHTHALMIC
  Filled 2014-11-29: qty 2.5

## 2014-11-29 MED ORDER — SODIUM CHLORIDE 0.9 % IJ SOLN
INTRAMUSCULAR | Status: AC
  Filled 2014-11-29: qty 50

## 2014-11-29 MED ORDER — MIDAZOLAM HCL 5 MG/5ML IJ SOLN
INTRAMUSCULAR | Status: DC | PRN
  Administered 2014-11-29: 0.5 mg via INTRAVENOUS
  Administered 2014-11-29: 1 mg via INTRAVENOUS
  Administered 2014-11-29: 0.5 mg via INTRAVENOUS

## 2014-11-29 MED ORDER — STERILE WATER FOR IRRIGATION IR SOLN
Status: DC | PRN
  Administered 2014-11-29: 1500 mL

## 2014-11-29 MED ORDER — ONDANSETRON HCL 4 MG/2ML IJ SOLN
INTRAMUSCULAR | Status: AC
  Filled 2014-11-29: qty 2

## 2014-11-29 MED ORDER — DEXAMETHASONE SODIUM PHOSPHATE 10 MG/ML IJ SOLN
10.0000 mg | Freq: Once | INTRAMUSCULAR | Status: AC
  Administered 2014-11-30: 10 mg via INTRAVENOUS
  Filled 2014-11-29: qty 1

## 2014-11-29 MED ORDER — BUPIVACAINE IN DEXTROSE 0.75-8.25 % IT SOLN
INTRATHECAL | Status: DC | PRN
  Administered 2014-11-29: 15 mg via INTRATHECAL

## 2014-11-29 MED ORDER — PREDNISOLONE ACETATE 1 % OP SUSP: 1.0000 [drp] | OPHTHALMIC | Status: DC

## 2014-11-29 MED ORDER — OXYCODONE HCL 5 MG/5ML PO SOLN
5.0000 mg | Freq: Once | ORAL | Status: DC | PRN
  Filled 2014-11-29: qty 5

## 2014-11-29 MED ORDER — METHOCARBAMOL 500 MG PO TABS
500.0000 mg | ORAL_TABLET | Freq: Four times a day (QID) | ORAL | Status: DC | PRN
  Administered 2014-11-29 – 2014-12-02 (×7): 500 mg via ORAL
  Filled 2014-11-29 (×7): qty 1

## 2014-11-29 MED ORDER — IPRATROPIUM BROMIDE 0.03 % NA SOLN
2.0000 | Freq: Every morning | NASAL | Status: DC
  Administered 2014-11-30 – 2014-12-01 (×2): 2 via NASAL
  Filled 2014-11-29: qty 30

## 2014-11-29 MED ORDER — SODIUM CHLORIDE 0.9 % IJ SOLN
INTRAMUSCULAR | Status: DC | PRN
  Administered 2014-11-29: 30 mL

## 2014-11-29 MED ORDER — ONDANSETRON HCL 4 MG/2ML IJ SOLN
INTRAMUSCULAR | Status: DC | PRN
  Administered 2014-11-29: 4 mg via INTRAVENOUS

## 2014-11-29 MED ORDER — ONDANSETRON HCL 4 MG PO TABS: 4.0000 mg | ORAL_TABLET | Freq: Four times a day (QID) | ORAL | Status: DC | PRN

## 2014-11-29 MED ORDER — MENTHOL 3 MG MT LOZG
1.0000 | LOZENGE | OROMUCOSAL | Status: DC | PRN
  Filled 2014-11-29: qty 9

## 2014-11-29 MED ORDER — TRANEXAMIC ACID 100 MG/ML IV SOLN: 1000.0000 mg | INTRAVENOUS | Status: DC

## 2014-11-29 SURGICAL SUPPLY — 62 items
BAG ZIPLOCK 12X15 (MISCELLANEOUS) ×3 IMPLANT
BANDAGE ELASTIC 6 VELCRO ST LF (GAUZE/BANDAGES/DRESSINGS) ×3 IMPLANT
BANDAGE ESMARK 6X9 LF (GAUZE/BANDAGES/DRESSINGS) ×1 IMPLANT
BLADE SAG 18X100X1.27 (BLADE) ×3 IMPLANT
BLADE SAW SGTL 11.0X1.19X90.0M (BLADE) ×3 IMPLANT
BNDG ESMARK 6X9 LF (GAUZE/BANDAGES/DRESSINGS) ×3
BNDG GAUZE ELAST 4 BULKY (GAUZE/BANDAGES/DRESSINGS) ×6 IMPLANT
BOWL SMART MIX CTS (DISPOSABLE) ×3 IMPLANT
CAP KNEE TOTAL 3 SIGMA ×3 IMPLANT
CEMENT HV SMART SET (Cement) ×6 IMPLANT
CLOSURE WOUND 1/2 X4 (GAUZE/BANDAGES/DRESSINGS) ×2
CUFF TOURN SGL QUICK 34 (TOURNIQUET CUFF) ×2
CUFF TRNQT CYL 34X4X40X1 (TOURNIQUET CUFF) ×1 IMPLANT
DECANTER SPIKE VIAL GLASS SM (MISCELLANEOUS) ×3 IMPLANT
DRAPE EXTREMITY T 121X128X90 (DRAPE) ×3 IMPLANT
DRAPE POUCH INSTRU U-SHP 10X18 (DRAPES) ×3 IMPLANT
DRAPE U-SHAPE 47X51 STRL (DRAPES) ×3 IMPLANT
DRSG ADAPTIC 3X8 NADH LF (GAUZE/BANDAGES/DRESSINGS) ×3 IMPLANT
DRSG PAD ABDOMINAL 8X10 ST (GAUZE/BANDAGES/DRESSINGS) ×3 IMPLANT
DURAPREP 26ML APPLICATOR (WOUND CARE) ×3 IMPLANT
ELECT REM PT RETURN 9FT ADLT (ELECTROSURGICAL) ×3
ELECTRODE REM PT RTRN 9FT ADLT (ELECTROSURGICAL) ×1 IMPLANT
EVACUATOR 1/8 PVC DRAIN (DRAIN) ×3 IMPLANT
FACESHIELD WRAPAROUND (MASK) ×15 IMPLANT
GAUZE SPONGE 4X4 12PLY STRL (GAUZE/BANDAGES/DRESSINGS) ×3 IMPLANT
GLOVE BIO SURGEON STRL SZ7.5 (GLOVE) IMPLANT
GLOVE BIO SURGEON STRL SZ8 (GLOVE) ×3 IMPLANT
GLOVE BIOGEL PI IND STRL 6.5 (GLOVE) IMPLANT
GLOVE BIOGEL PI IND STRL 8 (GLOVE) ×1 IMPLANT
GLOVE BIOGEL PI INDICATOR 6.5 (GLOVE)
GLOVE BIOGEL PI INDICATOR 8 (GLOVE) ×2
GLOVE SURG SS PI 6.5 STRL IVOR (GLOVE) IMPLANT
GOWN STRL REUS W/TWL LRG LVL3 (GOWN DISPOSABLE) ×3 IMPLANT
GOWN STRL REUS W/TWL XL LVL3 (GOWN DISPOSABLE) IMPLANT
HANDPIECE INTERPULSE COAX TIP (DISPOSABLE) ×2
IMMOBILIZER KNEE 20 (SOFTGOODS) ×3
IMMOBILIZER KNEE 20 THIGH 36 (SOFTGOODS) ×1 IMPLANT
KIT BASIN OR (CUSTOM PROCEDURE TRAY) ×3 IMPLANT
MANIFOLD NEPTUNE II (INSTRUMENTS) ×3 IMPLANT
NDL SAFETY ECLIPSE 18X1.5 (NEEDLE) ×2 IMPLANT
NEEDLE HYPO 18GX1.5 SHARP (NEEDLE) ×4
NS IRRIG 1000ML POUR BTL (IV SOLUTION) ×3 IMPLANT
PACK TOTAL JOINT (CUSTOM PROCEDURE TRAY) ×3 IMPLANT
PAD ABD 8X10 STRL (GAUZE/BANDAGES/DRESSINGS) ×3 IMPLANT
PADDING CAST COTTON 6X4 STRL (CAST SUPPLIES) ×9 IMPLANT
PEN SKIN MARKING BROAD (MISCELLANEOUS) ×3 IMPLANT
POSITIONER SURGICAL ARM (MISCELLANEOUS) ×3 IMPLANT
SET HNDPC FAN SPRY TIP SCT (DISPOSABLE) ×1 IMPLANT
STRIP CLOSURE SKIN 1/2X4 (GAUZE/BANDAGES/DRESSINGS) ×4 IMPLANT
SUCTION FRAZIER 12FR DISP (SUCTIONS) ×3 IMPLANT
SUT MNCRL AB 4-0 PS2 18 (SUTURE) ×3 IMPLANT
SUT VIC AB 2-0 CT1 27 (SUTURE) ×6
SUT VIC AB 2-0 CT1 TAPERPNT 27 (SUTURE) ×3 IMPLANT
SUT VLOC 180 0 24IN GS25 (SUTURE) ×3 IMPLANT
SYR 20CC LL (SYRINGE) ×3 IMPLANT
SYR 50ML LL SCALE MARK (SYRINGE) ×3 IMPLANT
TOWEL OR 17X26 10 PK STRL BLUE (TOWEL DISPOSABLE) ×3 IMPLANT
TOWEL OR NON WOVEN STRL DISP B (DISPOSABLE) IMPLANT
TRAY FOLEY CATH 14FRSI W/METER (CATHETERS) ×3 IMPLANT
WATER STERILE IRR 1500ML POUR (IV SOLUTION) ×3 IMPLANT
WRAP KNEE MAXI GEL POST OP (GAUZE/BANDAGES/DRESSINGS) ×3 IMPLANT
YANKAUER SUCT BULB TIP 10FT TU (MISCELLANEOUS) ×3 IMPLANT

## 2014-11-29 NOTE — Anesthesia Procedure Notes (Signed)
Spinal Patient location during procedure: OR Start time: 11/29/2014 7:29 AM End time: 11/29/2014 7:33 AM Staffing Resident/CRNA: Sherian Maroon A Performed by: resident/CRNA  Preanesthetic Checklist Completed: patient identified, site marked, surgical consent, pre-op evaluation, timeout performed, IV checked, risks and benefits discussed and monitors and equipment checked Spinal Block Patient position: sitting Prep: Betadine Patient monitoring: heart rate, cardiac monitor, continuous pulse ox and blood pressure Approach: midline Location: L3-4 Needle Needle type: Spinocan  Needle gauge: 22 G Needle length: 5 cm Needle insertion depth: 3 cm Assessment Sensory level: T10

## 2014-11-29 NOTE — Interval H&P Note (Signed)
History and Physical Interval Note:  11/29/2014 7:16 AM  Maria [name]  has presented today for surgery, with the diagnosis of OA LEFT KNEE  The various methods of treatment have been discussed with the patient and family. After consideration of risks, benefits and other options for treatment, the patient has consented to  Procedure(s): LEFT TOTAL KNEE ARTHROPLASTY (Left) as a surgical intervention .  The patient's history has been reviewed, patient examined, no change in status, stable for surgery.  I have reviewed the patient's chart and labs.  Questions were answered to the patient's satisfaction.     Gearlean Alf

## 2014-11-29 NOTE — Discharge Instructions (Addendum)
° °Dr. Frank Aluisio °Total Joint Specialist °[facility] °[address] °[place], [place] [address] °[phone] ° °TOTAL KNEE REPLACEMENT POSTOPERATIVE DIRECTIONS ° ° ° °Knee Rehabilitation, Guidelines Following Surgery  °Results after knee surgery are often greatly improved when you follow the exercise, range of motion and muscle strengthening exercises prescribed by your doctor. Safety measures are also important to protect the knee from further injury. Any time any of these exercises cause you to have increased pain or swelling in your knee joint, decrease the amount until you are comfortable again and slowly increase them. If you have problems or questions, call your caregiver or physical therapist for advice.  ° °HOME CARE INSTRUCTIONS  °Remove items at home which could result in a fall. This includes throw rugs or furniture in walking pathways.  °Continue medications as instructed at time of discharge. °You may have some home medications which will be placed on hold until you complete the course of blood thinner medication.  °You may start showering once you are discharged home but do not submerge the incision under water. Just pat the incision dry and apply a dry gauze dressing on daily. °Walk with walker as instructed.  °You may resume a sexual relationship in one month or when given the OK by  your doctor.  °· Use walker as long as suggested by your caregivers. °· Avoid periods of inactivity such as sitting longer than an hour when not asleep. This helps prevent blood clots.  °You may put full weight on your legs and walk as much as is comfortable.  °You may return to work once you are cleared by your doctor.  °Do not drive a car for 6 weeks or until released by you surgeon.  °· Do not drive while taking narcotics.  °Wear the elastic stockings for three weeks following surgery during the day but you may remove then at night. °Make sure you keep all of your appointments after your  operation with all of your doctors and caregivers. You should call the office at the above phone number and make an appointment for approximately two weeks after the date of your surgery. °Change the dressing daily and reapply a dry dressing each time. °Please pick up a stool softener and laxative for home use as long as you are requiring pain medications. °· ICE to the affected knee every three hours for 30 minutes at a time and then as needed for pain and swelling.  Continue to use ice on the knee for pain and swelling from surgery. You may notice swelling that will progress down to the foot and ankle.  This is normal after surgery.  Elevate the leg when you are not up walking on it.   °It is important for you to complete the blood thinner medication as prescribed by your doctor. °· Continue to use the breathing machine which will help keep your temperature down.  It is common for your temperature to cycle up and down following surgery, especially at night when you are not up moving around and exerting yourself.  The breathing machine keeps your lungs expanded and your temperature down. ° °RANGE OF MOTION AND STRENGTHENING EXERCISES  °Rehabilitation of the knee is important following a knee injury or an operation. After just a few days of immobilization, the muscles of the thigh which control the knee become weakened and shrink (atrophy). Knee exercises are designed to build up the tone and strength of the thigh muscles and to improve knee   motion. Often times heat used for twenty to thirty minutes before working out will loosen up your tissues and help with improving the range of motion but do not use heat for the first two weeks following surgery. These exercises can be done on a training (exercise) mat, on the floor, on a table or on a bed. Use what ever works the best and is most comfortable for you Knee exercises include:  °Leg Lifts - While your knee is still immobilized in a splint or cast, you can do  straight leg raises. Lift the leg to 60 degrees, hold for 3 sec, and slowly lower the leg. Repeat 10-20 times 2-3 times daily. Perform this exercise against resistance later as your knee gets better.  °Quad and Hamstring Sets - Tighten up the muscle on the front of the thigh (Quad) and hold for 5-10 sec. Repeat this 10-20 times hourly. Hamstring sets are done by pushing the foot backward against an object and holding for 5-10 sec. Repeat as with quad sets.  °A rehabilitation program following serious knee injuries can speed recovery and prevent re-injury in the future due to weakened muscles. Contact your doctor or a physical therapist for more information on knee rehabilitation.  ° °SKILLED REHAB INSTRUCTIONS: °If the patient is transferred to a skilled rehab facility following release from the hospital, a list of the current medications will be sent to the facility for the patient to continue.  When discharged from the skilled rehab facility, please have the facility set up the patient's Home Health Physical Therapy prior to being released. Also, the skilled facility will be responsible for providing the patient with their medications at time of release from the facility to include their pain medication, the muscle relaxants, and their blood thinner medication. If the patient is still at the rehab facility at time of the two week follow up appointment, the skilled rehab facility will also need to assist the patient in arranging follow up appointment in our office and any transportation needs. ° °MAKE SURE YOU:  °Understand these instructions.  °Will watch your condition.  °Will get help right away if you are not doing well or get worse.  ° ° °Pick up stool softner and laxative for home use following surgery while on pain medications. °Do not submerge incision under water. °Please use good hand washing techniques while changing dressing each day. °May shower starting three days after surgery. °Please use a clean  towel to pat the incision dry following showers. °Continue to use ice for pain and swelling after surgery. °Do not use any lotions or creams on the incision until instructed by your surgeon. ° °Take Xarelto for two and a half more weeks, then discontinue Xarelto. °Once the patient has completed the blood thinner regimen, then take a Baby 81 mg Aspirin daily for three more weeks. ° °Postoperative Constipation Protocol ° °Constipation - defined medically as fewer than three stools per week and severe constipation as less than one stool per week. ° °One of the most common issues patients have following surgery is constipation.  Even if you have a regular bowel pattern at home, your normal regimen is likely to be disrupted due to multiple reasons following surgery.  Combination of anesthesia, postoperative narcotics, change in appetite and fluid intake all can affect your bowels.  In order to avoid complications following surgery, here are some recommendations in order to help you during your recovery period. ° °Colace (docusate) - Pick up an over-the-counter form of   Colace or another stool softener and take twice a day as long as you are requiring postoperative pain medications.  Take with a full glass of water daily.  If you experience loose stools or diarrhea, hold the colace until you stool forms back up.  If your symptoms do not get better within 1 week or if they get worse, check with your doctor.  Dulcolax (bisacodyl) - Pick up over-the-counter and take as directed by the product packaging as needed to assist with the movement of your bowels.  Take with a full glass of water.  Use this product as needed if not relieved by Colace only.   MiraLax (polyethylene glycol) - Pick up over-the-counter to have on hand.  MiraLax is a solution that will increase the amount of water in your bowels to assist with bowel movements.  Take as directed and can mix with a glass of water, juice, soda, coffee, or tea.  Take if you  go more than two days without a movement. Do not use MiraLax more than once per day. Call your doctor if you are still constipated or irregular after using this medication for 7 days in a row.  If you continue to have problems with postoperative constipation, please contact the office for further assistance and recommendations.  If you experience "the worst abdominal pain ever" or develop nausea or vomiting, please contact the office immediatly for further recommendations for treatment.  When discharged from the skilled rehab facility, please have the facility set up the patient's [place] prior to being released.   Also provide the patient with their medications at time of release from the facility to include their pain medication, the muscle relaxants, and their blood thinner medication.  If the patient is still at the rehab facility at time of follow up appointment, please also assist the patient in arranging follow up appointment in our office and any transportation needs. ICE to the affected knee or hip every three hours for 30 minutes at a time and then as needed for pain and swelling.    Information on my medicine - XARELTO (Rivaroxaban)  This medication education was reviewed with me or my healthcare representative as part of my discharge preparation.  The pharmacist that spoke with me during my hospital stay was:  Rudean Haskell, [facility]  Why was Xarelto prescribed for you? Xarelto was prescribed for you to reduce the risk of blood clots forming after orthopedic surgery. The medical term for these abnormal blood clots is venous thromboembolism (VTE).  What do you need to know about xarelto ? Take your Xarelto ONCE DAILY at the same time every day. You may take it either with or without food.  If you have difficulty swallowing the tablet whole, you may crush it and mix in applesauce just prior to taking your dose.  Take Xarelto exactly as prescribed by your  doctor and DO NOT stop taking Xarelto without talking to the doctor who prescribed the medication.  Stopping without other VTE prevention medication to take the place of Xarelto may increase your risk of developing a clot.  After discharge, you should have regular check-up appointments with your healthcare provider that is prescribing your Xarelto.    What do you do if you miss a dose? If you miss a dose, take it as soon as you remember on the same day then continue your regularly scheduled once daily regimen the next day. Do not take two doses of Xarelto on the  same day.   Important Safety Information A possible side effect of Xarelto is bleeding. You should call your healthcare provider right away if you experience any of the following: ? Bleeding from an injury or your nose that does not stop. ? Unusual colored urine (red or dark brown) or unusual colored stools (red or black). ? Unusual bruising for unknown reasons. ? A serious fall or if you hit your head (even if there is no bleeding).  Some medicines may interact with Xarelto and might increase your risk of bleeding while on Xarelto. To help avoid this, consult your healthcare provider or pharmacist prior to using any new prescription or non-prescription medications, including herbals, vitamins, non-steroidal anti-inflammatory drugs (NSAIDs) and supplements.  This website has more information on Xarelto: [email or web address]

## 2014-11-29 NOTE — Anesthesia Postprocedure Evaluation (Signed)
Anesthesia Post Note  Patient: Maria [name]  Procedure(s) Performed: Procedure(s) (LRB): LEFT TOTAL KNEE ARTHROPLASTY (Left)  Anesthesia type: Spinal  Patient location: PACU  Post pain: Pain level controlled  Post assessment: Post-op Vital signs reviewed  Last Vitals: BP 120/80 mmHg  Pulse 49  Temp(Src) 36.3 C (Oral)  Resp 14  Ht 5\' 5"  (1.651 m)  Wt 144 lb (65.318 kg)  BMI 23.96 kg/m2  SpO2 96%  Post vital signs: Reviewed  Level of consciousness: sedated  Complications: No apparent anesthesia complications

## 2014-11-29 NOTE — Op Note (Addendum)
Pre-operative diagnosis- Osteoarthritis  Bilateral knee(s)  Post-operative diagnosis- Osteoarthritis Bilateral knee(s)  Procedure-  Left  Total Knee Arthroplasty; Right knee cortisone injection  Surgeon- Dione Plover. Olvin Rohr, MD  Assistant- Arlee Muslim, PA-C   Anesthesia-  Spinal  EBL-* No blood loss amount entered *   Drains Hemovac  Tourniquet time- 29 minutes @ 440 mm Hg    Complications- None  Condition-PACU - hemodynamically stable.   Brief Clinical Note  Maria [name] is a 81 y.o. year old female with end stage OA of her left knee with progressively worsening pain and dysfunction. She has constant pain, with activity and at rest and significant functional deficits with difficulties even with ADLs. She has had extensive non-op management including analgesics, injections of cortisone and viscosupplements, and home exercise program, but remains in significant pain with significant dysfunction. Radiographs show bone on bone arthritis all 3 compartments. She presents now for left Total Knee Arthroplasty.    Procedure in detail---   The patient is brought into the operating room and positioned supine on the operating table. After successful administration of  Spinal,   a tourniquet is placed high on the  Left thigh(s) and the lower extremity is prepped and draped in the usual sterile fashion. Time out is performed by the operating team and then the  Left lower extremity is wrapped in Esmarch, knee flexed and the tourniquet inflated to 300 mmHg.       A midline incision is made with a ten blade through the subcutaneous tissue to the level of the extensor mechanism. A fresh blade is used to make a medial parapatellar arthrotomy. Soft tissue over the proximal medial tibia is subperiosteally elevated to the joint line with a knife and into the semimembranosus bursa with a Cobb elevator. Soft tissue over the proximal lateral tibia is elevated with attention being paid to avoiding the patellar tendon  on the tibial tubercle. The patella is everted, knee flexed 90 degrees and the ACL and PCL are removed. Findings are bone on bone all 3 compartments with large global osteophytes.        The drill is used to create a starting hole in the distal femur and the canal is thoroughly irrigated with sterile saline to remove the fatty contents. The 5 degree Left  valgus alignment guide is placed into the femoral canal and the distal femoral cutting block is pinned to remove 10 mm off the distal femur. Resection is made with an oscillating saw.      The tibia is subluxed forward and the menisci are removed. The extramedullary alignment guide is placed referencing proximally at the medial aspect of the tibial tubercle and distally along the second metatarsal axis and tibial crest. The block is pinned to remove 23mm off the more deficient lateral  side. Resection is made with an oscillating saw. Size 2.5is the most appropriate size for the tibia and the proximal tibia is prepared with the modular drill and keel punch for that size.      The femoral sizing guide is placed and size 2.5 is most appropriate. Rotation is marked off the epicondylar axis and confirmed by creating a rectangular flexion gap at 90 degrees. The size 2.5 cutting block is pinned in this rotation and the anterior, posterior and chamfer cuts are made with the oscillating saw. The intercondylar block is then placed and that cut is made.      Trial size 2.5 tibial component, trial size 2.5 posterior stabilized femur and  a 10  mm posterior stabilized rotating platform insert trial is placed. Full extension is achieved with excellent varus/valgus and anterior/posterior balance throughout full range of motion. The patella is everted and thickness measured to be 22  mm. Free hand resection is taken to 12 mm, a 35 template is placed, lug holes are drilled, trial patella is placed, and it tracks normally. Osteophytes are removed off the posterior femur with the  trial in place. All trials are removed and the cut bone surfaces prepared with pulsatile lavage. Cement is mixed and once ready for implantation, the size 2.5 tibial implant, size  2.5 posterior stabilized femoral component, and the size 35 patella are cemented in place and the patella is held with the clamp. The trial insert is placed and the knee held in full extension. The Exparel (20 ml mixed with 30 ml saline) and .25% Bupivicaine, are injected into the extensor mechanism, posterior capsule, medial and lateral gutters and subcutaneous tissues.  All extruded cement is removed and once the cement is hard the permanent 10 mm posterior stabilized rotating platform insert is placed into the tibial tray.      The wound is copiously irrigated with saline solution and the extensor mechanism closed over a hemovac drain with #1 V-loc suture. The tourniquet is released for a total tourniquet time of 29  minutes. Flexion against gravity is 140 degrees and the patella tracks normally. Subcutaneous tissue is closed with 2.0 vicryl and subcuticular with running 4.0 Monocryl. The incision is cleaned and dried and steri-strips and a bulky sterile dressing are applied. The limb is placed into a knee immobilizer.       I then prepped the right knee with Betadine and injected 80 mg (2 ml) of Depomedrol into the knee without problems. The site is cleansed and a Band-Aid applied. The patient is then awakened and transported to recovery in stable condition.      Please note that a surgical assistant was a medical necessity for this procedure in order to perform it in a safe and expeditious manner. Surgical assistant was necessary to retract the ligaments and vital neurovascular structures to prevent injury to them and also necessary for proper positioning of the limb to allow for anatomic placement of the prosthesis.   Dione Plover Yadhira Mckneely, MD    11/29/2014, 8:28 AM

## 2014-11-29 NOTE — H&P (View-Only) (Signed)
Maria [name] DOB: Jan 07, 1931 Single / Language: Cleophus Molt / Race: White Female Date of Admission:  11/29/2014 CC: Left knee pain History of Present Illness The patient is a 81 year old female who comes in for a preoperative History and Physical. The patient is scheduled for a left total knee arthroplasty to be performed by Dr. Dione Plover. Aluisio, MD at [facility] on 11-29-2014. The patient is a 81 year old female who presents today for follow up of their knee. The patient is being followed for their bilateral knee pain and osteoarthritis. Symptoms reported today include: pain (but not all the time), aching, giving way, instability and difficulty ambulating. The patient feels that they are doing poorly and report their pain level to be moderate. The following medication has been used for pain control: Tylenol (as needed). Unfortunately, her left knee is getting progressively worse. The knee is giving out on her frequently. She has documented endstage arthritis of the knee. She says from a pain standpoint she can tolerate it. The problem is she cannot trust the knee and she has fallen and hurt herself. She is ready to proceed with the total knee arthroplasty. They have been treated conservatively in the past for the above stated problem and despite conservative measures, they continue to have progressive pain and severe functional limitations and dysfunction. They have failed non-operative management including home exercise, medications, and injections. It is felt that they would benefit from undergoing total joint replacement. Risks and benefits of the procedure have been discussed with the patient and they elect to proceed with surgery. There are no active contraindications to surgery such as ongoing infection or rapidly progressive neurological disease.   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  Allergies  Aspir-81 *ANALGESICS - NonNarcotic* sensativity  Family History Heart disease in female family member before age 37 Heart Disease grandfather fathers side Hypertension mother Cancer First Degree Relatives. sister and brother Osteoarthritis mother, father, sister, brother, grandmother fathers side and grandfather fathers side  Social History  Drug/Alcohol Rehab (Currently) no Alcohol use current drinker; drinks wine; 5-7 per week Living situation live alone Exercise Exercises daily; does other Illicit drug use no Children 2 Tobacco use Never smoker. 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 (;lmf001 (09/01/2010) - [facility] [address]/fva/lmf) Ibuprofen (200MG  Tablet, Oral) Active. Mood Formula (Oral) Active. (OTC Mood Elevator) Fish Oil Burp-Less (Oral) Specific dose unknown - Active. Xanax (0.25MG  Tablet, Oral) Active. Atrovent HFA (17MCG/ACT Aerosol Soln, Inhalation) Active. Vitamin D (Oral) Specific dose unknown - Active. Cranberry Fruit (Oral) Specific dose unknown - Active. Lisinopril (5MG  Tablet, Oral) Active. Armour Thyroid (30MG  Tablet, Oral) Active. Latanoprost  (0.005% Solution, Ophthalmic) Active. Tylenol (325MG  Tablet, 1 Oral) Active. Carvedilol (3.125MG  Tablet, Oral) Active.  Past Surgical History  Colon Polyp Removal - Open Colon  Polyp Removal - Colonoscopy Tonsillectomy Tubal Ligation Total Hip Replacement bilateral Breast Biopsy left Appendectomy S/p Right THA  Review of Systems General Not Present- Chills, Fatigue, Fever, Memory Loss, Night Sweats, Weight Gain and Weight Loss. Skin Not Present- Eczema, Hives, Itching, Lesions and Rash. HEENT Not Present- Dentures, Double Vision, Headache, Hearing Loss, Tinnitus and Visual Loss. Respiratory Not Present- Allergies, Chronic Cough, Coughing up blood, Shortness of breath at rest and Shortness of breath with exertion. Cardiovascular Not Present- Chest Pain, Difficulty Breathing Lying Down, Murmur, Palpitations, Racing/skipping heartbeats and Swelling. Gastrointestinal Not Present- Abdominal Pain, Bloody Stool, Constipation, Diarrhea, Difficulty Swallowing, Heartburn, Jaundice, Loss of appetitie, Nausea and Vomiting. Female Genitourinary Present- Urinary frequency and Urinating at Night. Not Present- Blood in Urine, Discharge, Flank Pain, Incontinence, Painful Urination, Urgency, Urinary Retention and Weak urinary stream. Musculoskeletal Present- Joint Pain, Muscle Pain and Muscle Weakness. Not Present- Back Pain, Joint Swelling, Morning Stiffness and Spasms. Neurological Not Present- Blackout spells, Difficulty with balance, Dizziness, Paralysis, Tremor and Weakness. Psychiatric Not Present- Insomnia.  Vitals  Weight: 144 lb Height: 65in Height was reported by patient. Body Surface Area: 1.72 m Body Mass Index: 23.96 kg/m  BP: 130/82 (Sitting, Left Arm, Standard) Regular rhythm with occassional ectopic beat   Physical Exam  General Mental Status -Alert, cooperative and good historian. General Appearance-pleasant, Not in acute distress. Orientation-Oriented  X3. Build & Nutrition-Well nourished and Well developed.  Head and Neck Head-normocephalic, atraumatic . Neck Global Assessment - supple, no bruit auscultated on the right, no bruit auscultated on the left.  Eye Pupil - Bilateral-Regular and Round. Motion - Bilateral-EOMI.  Chest and Lung Exam Auscultation Breath sounds - clear at anterior chest wall and clear at posterior chest wall. Adventitious sounds - No Adventitious sounds.  Cardiovascular Auscultation Rhythm - Regular rate and rhythm(occassional ectopic beat auscultated on exam.). Heart Sounds - S1 WNL and S2 WNL. Murmurs & Other Heart Sounds - Auscultation of the heart reveals - No Murmurs.  Abdomen Palpation/Percussion Tenderness - Abdomen is non-tender to palpation. Rigidity (guarding) - Abdomen is soft. Auscultation Auscultation of the abdomen reveals - Bowel sounds normal.  Female Genitourinary Note: Not done, not pertinent to present illness   Musculoskeletal Note: On exam she is alert and oriented in no apparent distress. Her left knee shows a valgus deformity. Range of motion is about 5 to 120. There is marked crepitus on range of motion. There is no instability noted.  RADIOGRAPHS: Reviewed her radiographs. She has bone on bone arthritis all three compartments, worse laterally with valgus noted.   Assessment & Plan Primary osteoarthritis of left knee (M17.12) Note:Plan is for a Left Total Knee Replacement by Dr. Wynelle Link.  Plan is to go to rehab.  PCP - Dr. Roque Cash  The patient does not have any contraindications and will receive TXA (tranexamic acid) prior to surgery.  Signed electronically by Joelene Millin, III PA-C

## 2014-11-29 NOTE — Transfer of Care (Signed)
Immediate Anesthesia Transfer of Care Note  Patient: Maria [name]  Procedure(s) Performed: Procedure(s): LEFT TOTAL KNEE ARTHROPLASTY (Left)  Patient Location: PACU  Anesthesia Type:Spinal  Level of Consciousness: awake, alert , oriented and patient cooperative  Airway & Oxygen Therapy: Patient Spontanous Breathing and Patient connected to face mask oxygen  Post-op Assessment: Report given to RN and Post -op Vital signs reviewed and stable  Post vital signs: Reviewed and stable  Last Vitals:  Filed Vitals:   11/29/14 0508  BP: 145/65  Pulse: 62  Temp: 36.6 C  Resp: 16    Complications: No apparent anesthesia complications

## 2014-11-30 LAB — CBC
HEMATOCRIT: 32.9 % — AB (ref 36.0–46.0)
Hemoglobin: 11.2 g/dL — ABNORMAL LOW (ref 12.0–15.0)
MCH: 31.9 pg (ref 26.0–34.0)
MCHC: 34 g/dL (ref 30.0–36.0)
MCV: 93.7 fL (ref 78.0–100.0)
Platelets: 171 10*3/uL (ref 150–400)
RBC: 3.51 MIL/uL — AB (ref 3.87–5.11)
RDW: 13.4 % (ref 11.5–15.5)
WBC: 13.3 10*3/uL — AB (ref 4.0–10.5)

## 2014-11-30 LAB — BASIC METABOLIC PANEL
ANION GAP: 6 (ref 5–15)
BUN: 13 mg/dL (ref 6–23)
CALCIUM: 8.3 mg/dL — AB (ref 8.4–10.5)
CHLORIDE: 102 mmol/L (ref 96–112)
CO2: 27 mmol/L (ref 19–32)
Creatinine, Ser: 0.66 mg/dL (ref 0.50–1.10)
GFR calc non Af Amer: 81 mL/min — ABNORMAL LOW (ref >90)
GLUCOSE: 149 mg/dL — AB (ref 70–99)
POTASSIUM: 4 mmol/L (ref 3.5–5.1)
Sodium: 135 mmol/L (ref 135–145)

## 2014-11-30 NOTE — Evaluation (Signed)
Physical Therapy Evaluation Patient Details Name: Maria [name] MRN: [id number] DOB: 1931-07-05 Today's Date: 11/30/2014   History of Present Illness  L TKR  Clinical Impression  Pt s/p L TKR presents with decreased L LE strength/ROM and post op pain limiting functional mobility.  Pt would benefit from follow up rehab at SNF level to maximize IND and safety prior to return home ALONE.  Follow Up Recommendations SNF    Equipment Recommendations  None recommended by PT    Recommendations for Other Services OT consult     Precautions / Restrictions Precautions Precautions: Knee;Fall Required Braces or Orthoses: Knee Immobilizer - Left Knee Immobilizer - Left: Discontinue once straight leg raise with < 10 degree lag Restrictions Weight Bearing Restrictions: No Other Position/Activity Restrictions: WBAT      Mobility  Bed Mobility Overal bed mobility: Needs Assistance Bed Mobility: Supine to Sit     Supine to sit: Min assist     General bed mobility comments: cues for sequence and use of R LE to self assist  Transfers Overall transfer level: Needs assistance Equipment used: Rolling walker (2 wheeled) Transfers: Sit to/from Stand Sit to Stand: Min assist;Mod assist         General transfer comment: cues for LE management and use of UEs to self assist  Ambulation/Gait Ambulation/Gait assistance: Min assist Ambulation Distance (Feet): 48 Feet Assistive device: Rolling walker (2 wheeled) Gait Pattern/deviations: Step-to pattern;Decreased step length - right;Decreased step length - left;Shuffle;Trunk flexed Gait velocity: decr   General Gait Details: cues for sequence, posture and position from [employer]            Wheelchair Mobility    Modified Rankin (Stroke Patients Only)       Balance                                             Pertinent Vitals/Pain Pain Assessment: 0-10 Pain Score: 5  Pain Location: 5 Pain Descriptors /  Indicators: Aching;Sore Pain Intervention(s): Limited activity within patient's tolerance;Monitored during session;Premedicated before session;Ice applied    Home Living Family/patient expects to be discharged to:: Skilled nursing facility Living Arrangements: Alone                    Prior Function Level of Independence: Independent with assistive device(s)               Hand Dominance   Dominant Hand: Right    Extremity/Trunk Assessment   Upper Extremity Assessment: Overall WFL for tasks assessed           Lower Extremity Assessment: LLE deficits/detail   LLE Deficits / Details: 3-/5 quads with AAROM at knee -10 - 50  Cervical / Trunk Assessment: Normal  Communication   Communication: No difficulties  Cognition Arousal/Alertness: Awake/alert Behavior During Therapy: WFL for tasks assessed/performed Overall Cognitive Status: Within Functional Limits for tasks assessed                      General Comments      Exercises Total Joint Exercises Ankle Circles/Pumps: AROM;Both;Supine;15 reps Quad Sets: AROM;Both;10 reps;Supine Heel Slides: AAROM;Left;15 reps;Supine Straight Leg Raises: AAROM;Left;10 reps;Supine      Assessment/Plan    PT Assessment Patient needs continued PT services  PT Diagnosis Difficulty walking   PT Problem List Decreased strength;Decreased range of motion;Decreased activity tolerance;Decreased  mobility;Decreased knowledge of use of DME;Pain  PT Treatment Interventions DME instruction;Gait training;Stair training;Functional mobility training;Therapeutic activities;Therapeutic exercise;Patient/family education   PT Goals (Current goals can be found in the Care Plan section) Acute Rehab PT Goals Patient Stated Goal: Go on my book tour PT Goal Formulation: With patient Time For Goal Achievement: 12/07/14 Potential to Achieve Goals: Good    Frequency 7X/week   Barriers to discharge        Co-evaluation                End of Session Equipment Utilized During Treatment: Gait belt;Left knee immobilizer Activity Tolerance: Patient tolerated treatment well Patient left: in chair;with call bell/phone within reach Nurse Communication: Mobility status         Time: 7062-3762 PT Time Calculation (min) (ACUTE ONLY): 39 min   Charges:   PT Evaluation $Initial PT Evaluation Tier I: 1 Procedure PT Treatments $Gait Training: 8-22 mins $Therapeutic Exercise: 8-22 mins   PT G Codes:        Maria [name] Dec 13, 2014, 9:42 AM

## 2014-11-30 NOTE — Progress Notes (Signed)
Clinical Social Work Department BRIEF PSYCHOSOCIAL ASSESSMENT 11/30/2014  Patient:  Maria [name], Maria [name]     Account Number:  [id number]     Admit date:  11/29/2014  Clinical Social Worker:  Earlie Server  Date/Time:  11/30/2014 10:40 AM  Referred by:  Physician  Date Referred:  11/30/2014 Referred for  SNF Placement   Other Referral:   Interview type:  Patient Other interview type:    PSYCHOSOCIAL DATA Living Status:  ALONE Admitted from facility:   Level of care:   Primary support name:  Peggy Primary support relationship to patient:  CHILD, ADULT Degree of support available:   Adequate    CURRENT CONCERNS Current Concerns  Post-Acute Placement   Other Concerns:    SOCIAL WORK ASSESSMENT / PLAN CSW received referral in order to assist with DC planning. CSW reviewed chart which stated that PT was recommending SNF. CSW met with patient at bedside. CSW introduced myself and explained role.    Patient reports that she lives at home alone and her dtr lives in [place]. Patient reports she has been through surgeries in the past and is aware of process. Patient went to [facility] in the past but they are not in-network with her insurance so patient has pre-registered at [facility]. CSW explained that CSW would contact SNF and would submit clinicals for insurance authorization.    CSW completed FL2 and sent information to [facility]. CSW faxed clinicals to [facility] and will continue to follow.   Assessment/plan status:  Psychosocial Support/Ongoing Assessment of Needs Other assessment/ plan:   Information/referral to community resources:   SNF information  Referral provided for [facility] for possible transportation needs after DC from SNF    PATIENT'S/FAMILY'S RESPONSE TO PLAN OF CARE: Patient alert and oriented and engaged in assessment. 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 Patient reports that family is supportive but live out of town. Patient reports she has volunteered at [employer] [facility] in the past and wondered if they assisted with transportation. CSW encouraged patient to follow up with them on outpatient basis if dtr is unable to provide transportation after DC from rehab. Patient thanked CSW for visit and reports it was nice to be able to talk about her writing and that she can still have a career despite having medical problems.       Sindy Messing, LCSW (Weekend Coverage)

## 2014-11-30 NOTE — Progress Notes (Signed)
Physical Therapy Treatment Patient Details Name: Maria [name] MRN: [id number] DOB: 19-Sep-1931 Today's Date: 15-Dec-2014    History of Present Illness L TKR    PT Comments      Follow Up Recommendations  SNF     Equipment Recommendations  None recommended by PT    Recommendations for Other Services OT consult     Precautions / Restrictions Precautions Precautions: Knee;Fall Required Braces or Orthoses: Knee Immobilizer - Left Knee Immobilizer - Left: Discontinue once straight leg raise with < 10 degree lag Restrictions Weight Bearing Restrictions: No Other Position/Activity Restrictions: WBAT    Mobility  Bed Mobility Overal bed mobility: Needs Assistance Bed Mobility: Sit to Supine     Supine to sit: Min assist Sit to supine: Min assist   General bed mobility comments: for LLE only  Transfers Overall transfer level: Needs assistance Equipment used: Rolling walker (2 wheeled) Transfers: Sit to/from Stand Sit to Stand: Min guard         General transfer comment: cues for LE management and use of UEs to self assist  Ambulation/Gait Ambulation/Gait assistance: Min assist Ambulation Distance (Feet): 74 Feet (and 20) Assistive device: Rolling walker (2 wheeled) Gait Pattern/deviations: Step-to pattern;Decreased step length - right;Decreased step length - left;Shuffle;Trunk flexed Gait velocity: decr   General Gait Details: cues for sequence, posture and position from [employer]            Wheelchair Mobility    Modified Rankin (Stroke Patients Only)       Balance                                    Cognition Arousal/Alertness: Awake/alert Behavior During Therapy: WFL for tasks assessed/performed Overall Cognitive Status: Within Functional Limits for tasks assessed                      Exercises      General Comments        Pertinent Vitals/Pain Pain Assessment: 0-10 Pain Score: 7  Pain Location: left  knee Pain Descriptors / Indicators: Aching;Sore Pain Intervention(s): Monitored during session;Repositioned;Ice applied    Home Living Family/patient expects to be discharged to:: Skilled nursing facility                    Prior Function            PT Goals (current goals can now be found in the care plan section) Acute Rehab PT Goals Patient Stated Goal: to go to [facility] and then home PT Goal Formulation: With patient Time For Goal Achievement: 12/07/14 Potential to Achieve Goals: Good    Frequency  7X/week    PT Plan Current plan remains appropriate    Co-evaluation             End of Session Equipment Utilized During Treatment: Gait belt;Left knee immobilizer Activity Tolerance: Patient tolerated treatment well Patient left: Other (comment) (EOB with OT)     Time: 1324-4010 PT Time Calculation (min) (ACUTE ONLY): 21 min  Charges:  $Gait Training: 8-22 mins                    G Codes:      Kristalynn Coddington 2014-12-15, 4:42 PM

## 2014-11-30 NOTE — Progress Notes (Signed)
Clinical Social Work Department CLINICAL SOCIAL WORK PLACEMENT NOTE 11/30/2014  Patient:  Maria [name], Maria [name]  Account Number:  [id number] Admit date:  11/29/2014  Clinical Social Worker:  Sindy Messing, LCSW  Date/time:  11/30/2014 10:45 AM  Clinical Social Work is seeking post-discharge placement for this patient at the following level of care:   SKILLED NURSING   (*CSW will update this form in Epic as items are completed)   11/30/2014  Patient/family provided with [place] Department of Clinical Social Work's list of facilities offering this level of care within the geographic area requested by the patient (or if unable, by the patient's family).  11/30/2014  Patient/family informed of their freedom to choose among providers that offer the needed level of care, that participate in Medicare, Medicaid or managed care program needed by the patient, have an available bed and are willing to accept the patient.  11/30/2014  Patient/family informed of [facility]' ownership interest in [facility], as well as of the fact that they are under no obligation to receive care at this facility.  PASARR submitted to EDS on existing # PASARR number received on   FL2 transmitted to all facilities in geographic area requested by pt/family on  11/30/2014 FL2 transmitted to all facilities within larger geographic area on   Patient informed that his/her managed care company has contracts with or will negotiate with  certain facilities, including the following:     Patient/family informed of bed offers received:  11/30/2014 Patient chooses bed at [facility] Physician recommends and patient chooses bed at    Patient to be transferred to  on   Patient to be transferred to facility by  Patient and family notified of transfer on  Name of family member notified:    The following physician request were entered in Epic:   Additional Comments:

## 2014-11-30 NOTE — Progress Notes (Signed)
CARE MANAGEMENT NOTE 11/30/2014  Patient:  Maria [name], Maria [name]   Account Number:  [id number]  Date Initiated:  11/30/2014  Documentation initiated by:  [facility]  Subjective/Objective Assessment:   LEFT TOTAL HIP ARTHROPLASTY ANTERIOR APPROACH     Action/Plan:   Anticipated DC Date:  12/02/2014   Anticipated DC Plan:  [place]  CM consult      Choice offered to / List presented to:             Status of service:  Completed, signed off Medicare Important Message given?   (If response is "NO", the following Medicare IM given date fields will be blank) Date Medicare IM given:   Medicare IM given by:   Date Additional Medicare IM given:   Additional Medicare IM given by:    Discharge Disposition:  [place]  Per UR Regulation:    If discussed at Long Length of Stay Meetings, dates discussed:    Comments:  11/30/2014 1028 Chart reviewed. Recommendations for SNF placement. CSW referral. Jonnie Finner RN CCM Case Mgmt phone [phone]

## 2014-11-30 NOTE — Progress Notes (Signed)
Subjective: 1 Day Post-Op Procedure(s) (LRB): LEFT TOTAL KNEE ARTHROPLASTY (Left) Patient reports pain as mild.  No N/v.  Tolerating regular diet.  Objective: Vital signs in last 24 hours: Temp:  [96.2 F (35.7 C)-98.4 F (36.9 C)] 98.1 F (36.7 C) (02/13 0659) Pulse Rate:  [49-79] 59 (02/13 0659) Resp:  [14-16] 16 (02/13 0659) BP: (105-132)/(55-93) 132/55 mmHg (02/13 0659) SpO2:  [94 %-100 %] 99 % (02/13 0659) Weight:  [65.318 kg (144 lb)] 65.318 kg (144 lb) (02/12 1110)  Intake/Output from previous day: 02/12 0701 - 02/13 0700 In: 3405 [P.O.:680; I.V.:2575; IV Piggyback:150] Out: 3115 [Urine:2810; Drains:255; Blood:50] Intake/Output this shift: Total I/O In: 240 [P.O.:240] Out: 200 [Urine:200]   Recent Labs  11/30/14 0500  HGB 11.2*    Recent Labs  11/30/14 0500  WBC 13.3*  RBC 3.51*  HCT 32.9*  PLT 171    Recent Labs  11/30/14 0500  NA 135  K 4.0  CL 102  CO2 27  BUN 13  CREATININE 0.66  GLUCOSE 149*  CALCIUM 8.3*   No results for input(s): LABPT, INR in the last 72 hours.  PE:  wn wd woman in nad.  A and O x 4.  Dressing c/d/i.  Assessment/Plan: 1 Day Post-Op Procedure(s) (LRB): LEFT TOTAL KNEE ARTHROPLASTY (Left) Up with therapy  Drain removed.  Plan SNF Monday.  Wylene Simmer 11/30/2014, 9:44 AM

## 2014-11-30 NOTE — Evaluation (Signed)
Occupational Therapy Evaluation and Discharge Patient Details Name: Araceli W Schuler MRN: [id number] DOB: [birth year] Today's Date: 11/30/2014    History of Present Illness L TKR   Clinical Impression   This 81 yo female admitted and underwent above presents to acute OT with increased pain, decreased mobility, decreased balance, and generalized weakness all affecting her ability to care for herself at a Mod I level as she was pta. She will benefit from continued OT at SNF to get to a Mod I to Independent level prior to returning home alone. Acute OT will sign off.    Follow Up Recommendations  SNF    Equipment Recommendations   (TBD at next venue)       Precautions / Restrictions Precautions Precautions: Knee;Fall Required Braces or Orthoses: Knee Immobilizer - Left Knee Immobilizer - Left: Discontinue once straight leg raise with < 10 degree lag Restrictions Weight Bearing Restrictions: No Other Position/Activity Restrictions: WBAT      Mobility Bed Mobility Overal bed mobility: Needs Assistance Bed Mobility: Sit to Supine       Sit to supine: Min assist   General bed mobility comments: for LLE only  Transfers Overall transfer level: Needs assistance Equipment used: Rolling walker (2 wheeled) Transfers: Sit to/from Stand Sit to Stand: Min guard                   ADL   Eating/Feeding: Independent;Sitting   Grooming: Set up;Sitting   Upper Body Bathing: Set up;Sitting   Lower Body Bathing: Minimal assistance (with min guard sit<>stand)   Upper Body Dressing : Set up;Sitting   Lower Body Dressing: Minimal assistance (with min guard sit<>stand)   Toilet Transfer: Min guard;Ambulation;RW   Toileting- Water quality scientist and Hygiene: Min guard;Sit to/from stand                         Pertinent Vitals/Pain Pain Assessment: 0-10 Pain Score: 7  Pain Location: left knee Pain Descriptors / Indicators: Aching;Sore Pain Intervention(s): Monitored  during session;Repositioned;Ice applied     Hand Dominance  right   Extremity/Trunk Assessment Upper Extremity Assessment Upper Extremity Assessment: Overall WFL for tasks assessed              Cognition Arousal/Alertness: Awake/alert Behavior During Therapy: WFL for tasks assessed/performed Overall Cognitive Status: Within Functional Limits for tasks assessed                                Home Living Family/patient expects to be discharged to:: Skilled nursing facility                                             OT Diagnosis: Generalized weakness;Acute pain   OT Problem List: Decreased strength;Decreased range of motion;Impaired balance (sitting and/or standing);Pain;Decreased knowledge of use of DME or AE   OT Treatment/Interventions:      OT Goals(Current goals can be found in the care plan section) Acute Rehab OT Goals Patient Stated Goal: to go to [facility] and then home  OT Frequency:                End of Session Equipment Utilized During Treatment: Rolling walker  Activity Tolerance: Patient tolerated treatment well Patient left: in bed;with call bell/phone within reach   Time: [phone]  OT Time Calculation (min): 17 min Charges:  OT General Charges $OT Visit: 1 Procedure OT Evaluation $Initial OT Evaluation Tier I: 1 Procedure  Almon Register [phone] 11/30/2014, 4:14 PM

## 2014-12-01 LAB — BASIC METABOLIC PANEL
Anion gap: 4 — ABNORMAL LOW (ref 5–15)
BUN: 16 mg/dL (ref 6–23)
CHLORIDE: 108 mmol/L (ref 96–112)
CO2: 29 mmol/L (ref 19–32)
CREATININE: 0.66 mg/dL (ref 0.50–1.10)
Calcium: 8.7 mg/dL (ref 8.4–10.5)
GFR calc Af Amer: >90 mL/min (ref >90)
GFR calc non Af Amer: 81 mL/min — ABNORMAL LOW (ref >90)
Glucose, Bld: 136 mg/dL — ABNORMAL HIGH (ref 70–99)
POTASSIUM: 4 mmol/L (ref 3.5–5.1)
Sodium: 141 mmol/L (ref 135–145)

## 2014-12-01 LAB — CBC
HCT: 29.6 % — ABNORMAL LOW (ref 36.0–46.0)
Hemoglobin: 9.9 g/dL — ABNORMAL LOW (ref 12.0–15.0)
MCH: 31.9 pg (ref 26.0–34.0)
MCHC: 33.4 g/dL (ref 30.0–36.0)
MCV: 95.5 fL (ref 78.0–100.0)
Platelets: 175 10*3/uL (ref 150–400)
RBC: 3.1 MIL/uL — ABNORMAL LOW (ref 3.87–5.11)
RDW: 14 % (ref 11.5–15.5)
WBC: 12 10*3/uL — ABNORMAL HIGH (ref 4.0–10.5)

## 2014-12-01 MED ORDER — LATANOPROST 0.005 % OP SOLN
1.0000 [drp] | Freq: Every day | OPHTHALMIC | Status: DC
  Administered 2014-12-02: 1 [drp] via OPHTHALMIC
  Filled 2014-12-01: qty 2.5

## 2014-12-01 NOTE — Plan of Care (Signed)
Problem: Phase III Progression Outcomes Goal: Anticoagulant follow-up in place Outcome: Not Applicable Date Met:  12/01/14 xarelto     

## 2014-12-01 NOTE — Progress Notes (Signed)
Physical Therapy Treatment Patient Details Name: Maria [name] MRN: [id number] DOB: October 15, 1931 Today's Date: December 18, 2014    History of Present Illness L TKR    PT Comments    Pt agreeable to therex only this am 2* fatigue - multiple rests required for task completion.   Follow Up Recommendations  SNF     Equipment Recommendations  None recommended by PT    Recommendations for Other Services OT consult     Precautions / Restrictions Precautions Precautions: Knee;Fall Required Braces or Orthoses: Knee Immobilizer - Left Knee Immobilizer - Left: Discontinue once straight leg raise with < 10 degree lag Restrictions Weight Bearing Restrictions: No Other Position/Activity Restrictions: WBAT    Mobility  Bed Mobility                  Transfers                    Ambulation/Gait                 Stairs            Wheelchair Mobility    Modified Rankin (Stroke Patients Only)       Balance                                    Cognition Arousal/Alertness: Awake/alert Behavior During Therapy: WFL for tasks assessed/performed Overall Cognitive Status: Within Functional Limits for tasks assessed                      Exercises Total Joint Exercises Ankle Circles/Pumps: AROM;Both;Supine;15 reps Quad Sets: AROM;Both;Supine;15 reps Heel Slides: AAROM;Left;15 reps;Supine Straight Leg Raises: AAROM;Left;Supine;15 reps    General Comments        Pertinent Vitals/Pain Pain Assessment: 0-10 Pain Score: 5  Pain Location: L knee Pain Descriptors / Indicators: Sore Pain Intervention(s): Limited activity within patient's tolerance;Monitored during session;Ice applied;Premedicated before session    Home Living                      Prior Function            PT Goals (current goals can now be found in the care plan section) Acute Rehab PT Goals Patient Stated Goal: to go to [facility] and then home PT Goal  Formulation: With patient Time For Goal Achievement: 12/07/14 Potential to Achieve Goals: Good Progress towards PT goals: Progressing toward goals    Frequency  7X/week    PT Plan Current plan remains appropriate    Co-evaluation             End of Session   Activity Tolerance: Patient tolerated treatment well;Patient limited by fatigue Patient left: in bed;with call bell/phone within reach     Time: 0920-0950 PT Time Calculation (min) (ACUTE ONLY): 30 min  Charges:  $Therapeutic Exercise: 23-37 mins                    G Codes:      Sharmaine Bain 12-18-2014, 1:02 PM

## 2014-12-01 NOTE — Progress Notes (Signed)
   Subjective: 2 Days Post-Op Procedure(s) (LRB): LEFT TOTAL KNEE ARTHROPLASTY (Left) Patient reports pain as mild.   Patient seen in rounds with Dr. Wynelle Link. Patient is well, but has had some minor complaints of pain in the knee, requiring pain medications Plan is to go Skilled nursing facility after hospital stay.  Objective: Vital signs in last 24 hours: Temp:  [97.8 F (36.6 C)-99 F (37.2 C)] 98.7 F (37.1 C) (02/14 0650) Pulse Rate:  [73-101] 77 (02/14 0650) Resp:  [16-20] 16 (02/14 0650) BP: (121-148)/(44-58) 136/58 mmHg (02/14 0650) SpO2:  [93 %-97 %] 95 % (02/14 0650)  Intake/Output from previous day:  Intake/Output Summary (Last 24 hours) at 12/01/14 0830 Last data filed at 12/01/14 0530  Gross per 24 hour  Intake    720 ml  Output   2175 ml  Net  -1455 ml    Intake/Output this shift:    Labs:  Recent Labs  11/30/14 0500 12/01/14 0506  HGB 11.2* 9.9*    Recent Labs  11/30/14 0500 12/01/14 0506  WBC 13.3* 12.0*  RBC 3.51* 3.10*  HCT 32.9* 29.6*  PLT 171 175    Recent Labs  11/30/14 0500 12/01/14 0506  NA 135 141  K 4.0 4.0  CL 102 108  CO2 27 29  BUN 13 16  CREATININE 0.66 0.66  GLUCOSE 149* 136*  CALCIUM 8.3* 8.7   No results for input(s): LABPT, INR in the last 72 hours.  EXAM General - Patient is Alert and Appropriate Extremity - Neurovascular intact Sensation intact distally Dorsiflexion/Plantar flexion intact Dressing/Incision - clean, dry, no drainage Motor Function - intact, moving foot and toes well on exam.   Past Medical History  Diagnosis Date  . Arthritis   . CHF (congestive heart failure)   . Glaucoma   . Post-nasal drip   . Osteoporosis   . History of stomach ulcers   . Incontinence   . Hx: UTI (urinary tract infection)   . Hypothyroidism   . Thyroid nodule   . Difficulty sleeping   . Anxiety     Assessment/Plan: 2 Days Post-Op Procedure(s) (LRB): LEFT TOTAL KNEE ARTHROPLASTY (Left) Principal  Problem:   OA (osteoarthritis) of knee  Estimated body mass index is 23.96 kg/(m^2) as calculated from the following:   Height as of this encounter: 5\' 5"  (1.651 m).   Weight as of this encounter: 65.318 kg (144 lb). Up with therapy Plan for discharge tomorrow Discharge to SNF  DVT Prophylaxis - Xarelto Weight-Bearing as tolerated to left leg Plan to [place], PA-C Orthopaedic Surgery 12/01/2014, 8:30 AM

## 2014-12-01 NOTE — Progress Notes (Signed)
Physical Therapy Treatment Patient Details Name: Maria [name] MRN: [id number] DOB: July 12, 1931 Today's Date: 12-17-2014    History of Present Illness L TKR    PT Comments      Follow Up Recommendations  SNF     Equipment Recommendations  None recommended by PT    Recommendations for Other Services OT consult     Precautions / Restrictions Precautions Precautions: Knee;Fall Required Braces or Orthoses: Knee Immobilizer - Left Knee Immobilizer - Left: Discontinue once straight leg raise with < 10 degree lag Restrictions Weight Bearing Restrictions: No Other Position/Activity Restrictions: WBAT    Mobility  Bed Mobility Overal bed mobility: Needs Assistance Bed Mobility: Supine to Sit;Sit to Supine     Supine to sit: Min assist Sit to supine: Min assist   General bed mobility comments: for LLE only  Transfers Overall transfer level: Needs assistance Equipment used: Rolling walker (2 wheeled) Transfers: Sit to/from Stand Sit to Stand: Min guard         General transfer comment: cues for LE management and use of UEs to self assist  Ambulation/Gait Ambulation/Gait assistance: Min assist;Min guard Ambulation Distance (Feet): 100 Feet (twice) Assistive device: Rolling walker (2 wheeled) Gait Pattern/deviations: Step-to pattern;Decreased step length - right;Decreased step length - left;Shuffle;Trunk flexed Gait velocity: decr   General Gait Details: cues for sequence, posture and position from [employer]            Wheelchair Mobility    Modified Rankin (Stroke Patients Only)       Balance                                    Cognition Arousal/Alertness: Awake/alert Behavior During Therapy: WFL for tasks assessed/performed Overall Cognitive Status: Within Functional Limits for tasks assessed                      Exercises      General Comments        Pertinent Vitals/Pain Pain Assessment: 0-10 Pain Score: 4   Pain Location: L knee  Pain Descriptors / Indicators: Sore Pain Intervention(s): Limited activity within patient's tolerance;Monitored during session;Premedicated before session;Ice applied    Home Living                      Prior Function            PT Goals (current goals can now be found in the care plan section) Acute Rehab PT Goals Patient Stated Goal: to go to [facility] and then home PT Goal Formulation: With patient Time For Goal Achievement: 12/07/14 Potential to Achieve Goals: Good Progress towards PT goals: Progressing toward goals    Frequency  7X/week    PT Plan Current plan remains appropriate    Co-evaluation             End of Session Equipment Utilized During Treatment: Gait belt;Left knee immobilizer Activity Tolerance: Patient tolerated treatment well Patient left: in bed;with call bell/phone within reach     Time: 1450-1532 PT Time Calculation (min) (ACUTE ONLY): 42 min  Charges:  $Gait Training: 23-37 mins                    G Codes:      Jaz Mallick Dec 17, 2014, 3:55 PM

## 2014-12-02 LAB — CBC
HCT: 29.5 % — ABNORMAL LOW (ref 36.0–46.0)
Hemoglobin: 9.9 g/dL — ABNORMAL LOW (ref 12.0–15.0)
MCH: 32.1 pg (ref 26.0–34.0)
MCHC: 33.6 g/dL (ref 30.0–36.0)
MCV: 95.8 fL (ref 78.0–100.0)
PLATELETS: 170 10*3/uL (ref 150–400)
RBC: 3.08 MIL/uL — ABNORMAL LOW (ref 3.87–5.11)
RDW: 14.2 % (ref 11.5–15.5)
WBC: 8.7 10*3/uL (ref 4.0–10.5)

## 2014-12-02 MED ORDER — METOCLOPRAMIDE HCL 5 MG PO TABS: 5.0000 mg | ORAL_TABLET | Freq: Three times a day (TID) | ORAL | Status: DC | PRN

## 2014-12-02 MED ORDER — OXYCODONE HCL 5 MG PO TABS: 5.0000 mg | ORAL_TABLET | ORAL | Status: DC | PRN

## 2014-12-02 MED ORDER — METHOCARBAMOL 500 MG PO TABS: 500.0000 mg | ORAL_TABLET | Freq: Four times a day (QID) | ORAL | Status: DC | PRN

## 2014-12-02 MED ORDER — TRAMADOL HCL 50 MG PO TABS: 50.0000 mg | ORAL_TABLET | Freq: Four times a day (QID) | ORAL | Status: AC | PRN

## 2014-12-02 MED ORDER — ONDANSETRON HCL 4 MG PO TABS: 4.0000 mg | ORAL_TABLET | Freq: Four times a day (QID) | ORAL | Status: DC | PRN

## 2014-12-02 MED ORDER — BISACODYL 10 MG RE SUPP: 10.0000 mg | Freq: Every day | RECTAL | Status: DC | PRN

## 2014-12-02 MED ORDER — RIVAROXABAN 10 MG PO TABS: 10.0000 mg | ORAL_TABLET | Freq: Every day | ORAL | Status: DC

## 2014-12-02 MED ORDER — POLYETHYLENE GLYCOL 3350 17 G PO PACK: 17.0000 g | PACK | Freq: Every day | ORAL | Status: DC | PRN

## 2014-12-02 MED ORDER — DOCUSATE SODIUM 100 MG PO CAPS: 100.0000 mg | ORAL_CAPSULE | Freq: Two times a day (BID) | ORAL | Status: DC

## 2014-12-02 NOTE — Discharge Summary (Signed)
Physician Discharge Summary   Patient ID: Maria [name] MRN: [id number] DOB/AGE: [id number] 79 y.o.  Admit date: 11/29/2014 Discharge date: 12-02-2014  Primary Diagnosis:  Osteoarthritis Bilateral knee(s) Admission Diagnoses:  Past Medical History  Diagnosis Date  . Arthritis   . CHF (congestive heart failure)   . Glaucoma   . Post-nasal drip   . Osteoporosis   . History of stomach ulcers   . Incontinence   . Hx: UTI (urinary tract infection)   . Hypothyroidism   . Thyroid nodule   . Difficulty sleeping   . Anxiety    Discharge Diagnoses:   Principal Problem:   OA (osteoarthritis) of knee  Estimated body mass index is 23.96 kg/(m^2) as calculated from the following:   Height as of this encounter: '5\' 5"'  (1.651 m).   Weight as of this encounter: 65.318 kg (144 lb).  Procedure:  Procedure(s) (LRB): LEFT TOTAL KNEE ARTHROPLASTY (Left)   Consults: None  HPI: Maria [name] is a 81 y.o. year old female with end stage OA of her left knee with progressively worsening pain and dysfunction. She has constant pain, with activity and at rest and significant functional deficits with difficulties even with ADLs. She has had extensive non-op management including analgesics, injections of cortisone and viscosupplements, and home exercise program, but remains in significant pain with significant dysfunction. Radiographs show bone on bone arthritis all 3 compartments. She presents now for left Total Knee Arthroplasty.  Laboratory Data: Admission on 11/29/2014  Component Date Value Ref Range Status  . WBC 11/30/2014 13.3* 4.0 - 10.5 K/uL Final  . RBC 11/30/2014 3.51* 3.87 - 5.11 MIL/uL Final  . Hemoglobin 11/30/2014 11.2* 12.0 - 15.0 g/dL Final  . HCT 11/30/2014 32.9* 36.0 - 46.0 % Final  . MCV 11/30/2014 93.7  78.0 - 100.0 fL Final  . MCH 11/30/2014 31.9  26.0 - 34.0 pg Final  . MCHC 11/30/2014 34.0  30.0 - 36.0 g/dL Final  . RDW 11/30/2014 13.4  11.5 - 15.5 % Final  . Platelets 11/30/2014  171  150 - 400 K/uL Final  . Sodium 11/30/2014 135  135 - 145 mmol/L Final  . Potassium 11/30/2014 4.0  3.5 - 5.1 mmol/L Final  . Chloride 11/30/2014 102  96 - 112 mmol/L Final  . CO2 11/30/2014 27  19 - 32 mmol/L Final  . Glucose, Bld 11/30/2014 149* 70 - 99 mg/dL Final  . BUN 11/30/2014 13  6 - 23 mg/dL Final  . Creatinine, Ser 11/30/2014 0.66  0.50 - 1.10 mg/dL Final  . Calcium 11/30/2014 8.3* 8.4 - 10.5 mg/dL Final  . GFR calc non Af Amer 11/30/2014 81* >90 mL/min Final  . GFR calc Af Amer 11/30/2014 >90  >90 mL/min Final   Comment: (NOTE) The eGFR has been calculated using the CKD EPI equation. This calculation has not been validated in all clinical situations. eGFR's persistently <90 mL/min signify possible Chronic Kidney Disease.   . Anion gap 11/30/2014 6  5 - 15 Final  . WBC 12/01/2014 12.0* 4.0 - 10.5 K/uL Final  . RBC 12/01/2014 3.10* 3.87 - 5.11 MIL/uL Final  . Hemoglobin 12/01/2014 9.9* 12.0 - 15.0 g/dL Final  . HCT 12/01/2014 29.6* 36.0 - 46.0 % Final  . MCV 12/01/2014 95.5  78.0 - 100.0 fL Final  . MCH 12/01/2014 31.9  26.0 - 34.0 pg Final  . MCHC 12/01/2014 33.4  30.0 - 36.0 g/dL Final  . RDW 12/01/2014 14.0  11.5 - 15.5 % Final  .  Platelets 12/01/2014 175  150 - 400 K/uL Final  . Sodium 12/01/2014 141  135 - 145 mmol/L Final  . Potassium 12/01/2014 4.0  3.5 - 5.1 mmol/L Final  . Chloride 12/01/2014 108  96 - 112 mmol/L Final  . CO2 12/01/2014 29  19 - 32 mmol/L Final  . Glucose, Bld 12/01/2014 136* 70 - 99 mg/dL Final  . BUN 12/01/2014 16  6 - 23 mg/dL Final  . Creatinine, Ser 12/01/2014 0.66  0.50 - 1.10 mg/dL Final  . Calcium 12/01/2014 8.7  8.4 - 10.5 mg/dL Final  . GFR calc non Af Amer 12/01/2014 81* >90 mL/min Final  . GFR calc Af Amer 12/01/2014 >90  >90 mL/min Final   Comment: (NOTE) The eGFR has been calculated using the CKD EPI equation. This calculation has not been validated in all clinical situations. eGFR's persistently <90 mL/min signify possible  Chronic Kidney Disease.   . Anion gap 12/01/2014 4* 5 - 15 Final  . WBC 12/02/2014 8.7  4.0 - 10.5 K/uL Final  . RBC 12/02/2014 3.08* 3.87 - 5.11 MIL/uL Final  . Hemoglobin 12/02/2014 9.9* 12.0 - 15.0 g/dL Final  . HCT 12/02/2014 29.5* 36.0 - 46.0 % Final  . MCV 12/02/2014 95.8  78.0 - 100.0 fL Final  . MCH 12/02/2014 32.1  26.0 - 34.0 pg Final  . MCHC 12/02/2014 33.6  30.0 - 36.0 g/dL Final  . RDW 12/02/2014 14.2  11.5 - 15.5 % Final  . Platelets 12/02/2014 170  150 - 400 K/uL Final  Hospital Outpatient Visit on 11/21/2014  Component Date Value Ref Range Status  . MRSA, PCR 11/21/2014 NEGATIVE  NEGATIVE Final  . Staphylococcus aureus 11/21/2014 NEGATIVE  NEGATIVE Final   Comment:        The Xpert SA Assay (FDA approved for NASAL specimens in patients over 45 years of age), is one component of a comprehensive surveillance program.  Test performance has been validated by [facility] for patients greater than or equal to 70 year old. It is not intended to diagnose infection nor to guide or monitor treatment.   Marland Kitchen aPTT 11/21/2014 30  24 - 37 seconds Final  . WBC 11/21/2014 7.6  4.0 - 10.5 K/uL Final  . RBC 11/21/2014 4.90  3.87 - 5.11 MIL/uL Final  . Hemoglobin 11/21/2014 15.8* 12.0 - 15.0 g/dL Final  . HCT 11/21/2014 46.9* 36.0 - 46.0 % Final  . MCV 11/21/2014 95.7  78.0 - 100.0 fL Final  . MCH 11/21/2014 32.2  26.0 - 34.0 pg Final  . MCHC 11/21/2014 33.7  30.0 - 36.0 g/dL Final  . RDW 11/21/2014 13.4  11.5 - 15.5 % Final  . Platelets 11/21/2014 227  150 - 400 K/uL Final  . Sodium 11/21/2014 141  135 - 145 mmol/L Final  . Potassium 11/21/2014 4.1  3.5 - 5.1 mmol/L Final  . Chloride 11/21/2014 103  96 - 112 mmol/L Final  . CO2 11/21/2014 31  19 - 32 mmol/L Final  . Glucose, Bld 11/21/2014 101* 70 - 99 mg/dL Final  . BUN 11/21/2014 22  6 - 23 mg/dL Final  . Creatinine, Ser 11/21/2014 1.00  0.50 - 1.10 mg/dL Final  . Calcium 11/21/2014 9.3  8.4 - 10.5 mg/dL Final  . Total  Protein 11/21/2014 6.8  6.0 - 8.3 g/dL Final  . Albumin 11/21/2014 4.1  3.5 - 5.2 g/dL Final  . AST 11/21/2014 21  0 - 37 U/L Final  . ALT 11/21/2014 19  0 -  35 U/L Final  . Alkaline Phosphatase 11/21/2014 83  39 - 117 U/L Final  . Total Bilirubin 11/21/2014 0.9  0.3 - 1.2 mg/dL Final  . GFR calc non Af Amer 11/21/2014 51* >90 mL/min Final  . GFR calc Af Amer 11/21/2014 60* >90 mL/min Final   Comment: (NOTE) The eGFR has been calculated using the CKD EPI equation. This calculation has not been validated in all clinical situations. eGFR's persistently <90 mL/min signify possible Chronic Kidney Disease.   . Anion gap 11/21/2014 7  5 - 15 Final  . Prothrombin Time 11/21/2014 13.2  11.6 - 15.2 seconds Final  . INR 11/21/2014 0.99  0.00 - 1.49 Final  . ABO/RH(D) 11/21/2014 O POS   Final  . Antibody Screen 11/21/2014 NEG   Final  . Sample Expiration 11/21/2014 12/02/2014   Final  . Color, Urine 11/21/2014 YELLOW  YELLOW Final  . APPearance 11/21/2014 CLEAR  CLEAR Final  . Specific Gravity, Urine 11/21/2014 1.017  1.005 - 1.030 Final  . pH 11/21/2014 6.0  5.0 - 8.0 Final  . Glucose, UA 11/21/2014 NEGATIVE  NEGATIVE mg/dL Final  . Hgb urine dipstick 11/21/2014 NEGATIVE  NEGATIVE Final  . Bilirubin Urine 11/21/2014 NEGATIVE  NEGATIVE Final  . Ketones, ur 11/21/2014 NEGATIVE  NEGATIVE mg/dL Final  . Protein, ur 11/21/2014 NEGATIVE  NEGATIVE mg/dL Final  . Urobilinogen, UA 11/21/2014 0.2  0.0 - 1.0 mg/dL Final  . Nitrite 11/21/2014 NEGATIVE  NEGATIVE Final  . Leukocytes, UA 11/21/2014 SMALL* NEGATIVE Final  . Squamous Epithelial / LPF 11/21/2014 RARE  RARE Final  . WBC, UA 11/21/2014 0-2  <3 WBC/hpf Final  . Bacteria, UA 11/21/2014 RARE  RARE Final  . Edwina Barth 11/21/2014 MUCOUS PRESENT   Final     X-Rays:No results found.  EKG:No orders found for this or any previous visit.   Hospital Course: Maria [name] is a 81 y.o. who was admitted to [facility]. They were brought to the  operating room on 11/29/2014 and underwent Procedure(s): LEFT TOTAL KNEE ARTHROPLASTY.  Patient tolerated the procedure well and was later transferred to the recovery room and then to the orthopaedic floor for postoperative care.  They were given PO and IV analgesics for pain control following their surgery.  They were given 24 hours of postoperative antibiotics of  Anti-infectives    Start     Dose/Rate Route Frequency Ordered Stop   11/29/14 1330  ceFAZolin (ANCEF) IVPB 2 g/50 mL premix     2 g 100 mL/hr over 30 Minutes Intravenous Every 6 hours 11/29/14 1122 11/29/14 1953   11/29/14 0509  ceFAZolin (ANCEF) IVPB 2 g/50 mL premix     2 g 100 mL/hr over 30 Minutes Intravenous On call to O.R. 11/29/14 [id number] 11/29/14 0720     and started on DVT prophylaxis in the form of Xarelto.   PT and OT were ordered for total joint protocol.  Discharge planning consulted to help with postop disposition and equipment needs.  Patient had a decent night on the evening of surgery.  They started to get up OOB with therapy on day one. Hemovac drain was pulled without difficulty.  Continued to work with therapy into day two.  Dressing was changed on day two and the incision was healing well.  By day three, the patient had progressed with therapy and meeting their goals.  Incision was healing well.  Patient was seen in rounds and was ready to go to the SNF - [facility].  Discharge to SNF Diet - Cardiac diet Follow up - in 2 weeks Activity - WBAT Disposition - SNF Condition Upon Discharge - Stable D/C Meds - See DC Summary DVT Prophylaxis - Xarelto      Discharge Instructions    Call MD / Call 911    Complete by:  As directed   If you experience chest pain or shortness of breath, CALL 911 and be transported to the hospital emergency room.  If you develope a fever above 101 F, pus (white drainage) or increased drainage or redness at the wound, or calf pain, call your surgeon's office.     Change dressing     Complete by:  As directed   Change dressing daily with sterile 4 x 4 inch gauze dressing and apply TED hose. Do not submerge the incision under water.     Constipation Prevention    Complete by:  As directed   Drink plenty of fluids.  Prune juice may be helpful.  You may use a stool softener, such as Colace (over the counter) 100 mg twice a day.  Use MiraLax (over the counter) for constipation as needed.     Diet - low sodium heart healthy    Complete by:  As directed      Discharge instructions    Complete by:  As directed   Pick up stool softner and laxative for home use following surgery while on pain medications. Do not submerge incision under water. Please use good hand washing techniques while changing dressing each day. May shower starting three days after surgery. Please use a clean towel to pat the incision dry following showers. Continue to use ice for pain and swelling after surgery. Do not use any lotions or creams on the incision until instructed by your surgeon.  Take Xarelto for two and a half more weeks, then discontinue Xarelto. Once the patient has completed the blood thinner regimen, then take a Baby 81 mg Aspirin daily for three more weeks.  Postoperative Constipation Protocol  Constipation - defined medically as fewer than three stools per week and severe constipation as less than one stool per week.  One of the most common issues patients have following surgery is constipation.  Even if you have a regular bowel pattern at home, your normal regimen is likely to be disrupted due to multiple reasons following surgery.  Combination of anesthesia, postoperative narcotics, change in appetite and fluid intake all can affect your bowels.  In order to avoid complications following surgery, here are some recommendations in order to help you during your recovery period.  Colace (docusate) - Pick up an over-the-counter form of Colace or another stool softener and take twice a day  as long as you are requiring postoperative pain medications.  Take with a full glass of water daily.  If you experience loose stools or diarrhea, hold the colace until you stool forms back up.  If your symptoms do not get better within 1 week or if they get worse, check with your doctor.  Dulcolax (bisacodyl) - Pick up over-the-counter and take as directed by the product packaging as needed to assist with the movement of your bowels.  Take with a full glass of water.  Use this product as needed if not relieved by Colace only.   MiraLax (polyethylene glycol) - Pick up over-the-counter to have on hand.  MiraLax is a solution that will increase the amount of water in your bowels to assist with bowel movements.  Take as directed and can mix with a glass of water, juice, soda, coffee, or tea.  Take if you go more than two days without a movement. Do not use MiraLax more than once per day. Call your doctor if you are still constipated or irregular after using this medication for 7 days in a row.  If you continue to have problems with postoperative constipation, please contact the office for further assistance and recommendations.  If you experience "the worst abdominal pain ever" or develop nausea or vomiting, please contact the office immediatly for further recommendations for treatment.  When discharged from the skilled rehab facility, please have the facility set up the patient's [place] prior to being released.   Also provide the patient with their medications at time of release from the facility to include their pain medication, the muscle relaxants, and their blood thinner medication.  If the patient is still at the rehab facility at time of follow up appointment, please also assist the patient in arranging follow up appointment in our office and any transportation needs. ICE to the affected knee or hip every three hours for 30 minutes at a time and then as needed for pain and  swelling.     Do not put a pillow under the knee. Place it under the heel.    Complete by:  As directed      Do not sit on low chairs, stoools or toilet seats, as it may be difficult to get up from low surfaces    Complete by:  As directed      Driving restrictions    Complete by:  As directed   No driving until released by the physician.     Increase activity slowly as tolerated    Complete by:  As directed      Lifting restrictions    Complete by:  As directed   No lifting until released by the physician.     Patient may shower    Complete by:  As directed   You may shower without a dressing once there is no drainage.  Do not wash over the wound.  If drainage remains, do not shower until drainage stops.     TED hose    Complete by:  As directed   Use stockings (TED hose) for 3 weeks on both leg(s).  You may remove them at night for sleeping.     Weight bearing as tolerated    Complete by:  As directed   Laterality:  left  Extremity:  Lower            Medication List    STOP taking these medications        CALCIUM 600 + D PO     cephALEXin 500 MG capsule  Commonly known as:  KEFLEX     cholecalciferol 1000 UNITS tablet  Commonly known as:  VITAMIN D     CRANBERRY PO     Fish Oil 1200 MG Caps      TAKE these medications        acetaminophen 650 MG CR tablet  Commonly known as:  TYLENOL  Take 650 mg by mouth every 8 (eight) hours as needed for pain.     acyclovir ointment 5 %  Commonly known as:  ZOVIRAX  Apply 1 application topically daily as needed (for outbreaks).     ALPRAZolam 0.25 MG tablet  Commonly known as:  XANAX  Take 0.25 mg by mouth  3 (three) times daily.     bisacodyl 10 MG suppository  Commonly known as:  DULCOLAX  Place 1 suppository (10 mg total) rectally daily as needed for moderate constipation.     carvedilol 3.125 MG tablet  Commonly known as:  COREG  Take 3.125 mg by mouth 2 (two) times daily with a meal.     docusate sodium 100  MG capsule  Commonly known as:  COLACE  Take 1 capsule (100 mg total) by mouth 2 (two) times daily.     ipratropium 0.03 % nasal spray  Commonly known as:  ATROVENT  Place 2 sprays into both nostrils every morning.     latanoprost 0.005 % ophthalmic solution  Commonly known as:  XALATAN  Place 1 drop into both eyes every morning.     lisinopril 5 MG tablet  Commonly known as:  PRINIVIL,ZESTRIL  Take 5 mg by mouth every morning.     methocarbamol 500 MG tablet  Commonly known as:  ROBAXIN  Take 1 tablet (500 mg total) by mouth every 6 (six) hours as needed for muscle spasms.     metoCLOPramide 5 MG tablet  Commonly known as:  REGLAN  Take 1 tablet (5 mg total) by mouth every 8 (eight) hours as needed for nausea (if ondansetron (ZOFRAN) ineffective.).     ondansetron 4 MG tablet  Commonly known as:  ZOFRAN  Take 1 tablet (4 mg total) by mouth every 6 (six) hours as needed for nausea.     oxyCODONE 5 MG immediate release tablet  Commonly known as:  Oxy IR/ROXICODONE  Take 1-2 tablets (5-10 mg total) by mouth every 3 (three) hours as needed for moderate pain, severe pain or breakthrough pain.     polyethylene glycol packet  Commonly known as:  MIRALAX / GLYCOLAX  Take 17 g by mouth daily as needed for mild constipation.     prednisoLONE acetate 1 % ophthalmic suspension  Commonly known as:  PRED FORTE  Place 1 drop into both eyes See admin instructions. 1 drop three times a day for 1 week, 1 drop two times a day for 1 week, then 1 drop one time a day for 1 week.     rivaroxaban 10 MG Tabs tablet  Commonly known as:  XARELTO  - Take 1 tablet (10 mg total) by mouth daily with breakfast. Take Xarelto for two and a half more weeks, then discontinue Xarelto.  - Once the patient has completed the blood thinner regimen, then take a Baby 81 mg Aspirin daily for three more weeks.     thyroid 30 MG tablet  Commonly known as:  ARMOUR  Take 30 mg by mouth every morning.     traMADol  50 MG tablet  Commonly known as:  ULTRAM  Take 1-2 tablets (50-100 mg total) by mouth every 6 (six) hours as needed (mild pain).       Follow-up Information    Follow up with Gearlean Alf, MD. Schedule an appointment as soon as possible for a visit on 12/12/2014.   Specialty:  Orthopedic Surgery   Why:  Call office ASAP at [phone] to set up appointment with Dr. Denman George information:   [address] [address] [phone]       Signed: Arlee Muslim, PA-C Orthopaedic Surgery 12/02/2014, 7:15 AM

## 2014-12-02 NOTE — Progress Notes (Addendum)
Physical Therapy Treatment Patient Details Name: Maria [name] MRN: [id number] DOB: 1931-05-19 Today's Date: 12/02/2014    History of Present Illness L TKR    PT Comments    POD # 3 am session.  Applied KI as pt was unable to perform SLR.  Instructed on KI use for amb and when to D/C.  Assisted to BR then amb in hallway limited distance.  Performed all supine TKR TE's followed by ICE.   Follow Up Recommendations  SNF     Equipment Recommendations       Recommendations for Other Services       Precautions / Restrictions Precautions Precautions: Knee;Fall Precaution Comments: instructed pt on KI use for amb Required Braces or Orthoses: Knee Immobilizer - Left Restrictions Weight Bearing Restrictions: No Other Position/Activity Restrictions: WBAT    Mobility  Bed Mobility               General bed mobility comments: Pt OOB in recliner  Transfers Overall transfer level: Needs assistance Equipment used: Rolling walker (2 wheeled) Transfers: Sit to/from Stand Sit to Stand: Min guard;Min assist         General transfer comment: cues for LE management and use of UEs to self assist  Ambulation/Gait Ambulation/Gait assistance: Min guard;Min assist Ambulation Distance (Feet): 78 Feet Assistive device: Rolling walker (2 wheeled) Gait Pattern/deviations: Step-to pattern;Trunk flexed Gait velocity: decreased   General Gait Details: cues for sequence, posture and position from [employer]            Wheelchair Mobility    Modified Rankin (Stroke Patients Only)       Balance                                    Cognition Arousal/Alertness: Awake/alert Behavior During Therapy: WFL for tasks assessed/performed Overall Cognitive Status: Within Functional Limits for tasks assessed                      Exercises   Total Knee Replacement TE's 10 reps B LE ankle pumps 10 reps towel squeezes 10 reps knee presses 10 reps heel slides   10 reps SAQ's 10 reps SLR's 10 reps ABD Followed by ICE     General Comments        Pertinent Vitals/Pain Pain Assessment: 0-10 Pain Score: 3  Pain Location: L knee Pain Descriptors / Indicators: Sore Pain Intervention(s): Monitored during session;Premedicated before session;Repositioned;Ice applied    Home Living                      Prior Function            PT Goals (current goals can now be found in the care plan section) Progress towards PT goals: Progressing toward goals    Frequency  7X/week    PT Plan      Co-evaluation             End of Session Equipment Utilized During Treatment: Gait belt;Left knee immobilizer Activity Tolerance: Patient tolerated treatment well Patient left: in chair;with call bell/phone within reach     Time: 0835-0900 PT Time Calculation (min) (ACUTE ONLY): 25 min  Charges:  $Gait Training: 8-22 mins $Therapeutic Exercise: 8-22 mins                    G Codes:  Rica Koyanagi  PTA [facility]  Acute  Rehab Pager      [phone]

## 2014-12-02 NOTE — Progress Notes (Signed)
Clinical Social Work Department CLINICAL SOCIAL WORK PLACEMENT NOTE 12/02/2014  Patient:  Maria [name], Maria [name]  Account Number:  [id number] Admit date:  11/29/2014  Clinical Social Worker:  Sindy Messing, LCSW  Date/time:  11/30/2014 10:45 AM  Clinical Social Work is seeking post-discharge placement for this patient at the following level of care:   SKILLED NURSING   (*CSW will update this form in Epic as items are completed)   11/30/2014  Patient/family provided with [place] Department of Clinical Social Work's list of facilities offering this level of care within the geographic area requested by the patient (or if unable, by the patient's family).  11/30/2014  Patient/family informed of their freedom to choose among providers that offer the needed level of care, that participate in Medicare, Medicaid or managed care program needed by the patient, have an available bed and are willing to accept the patient.  11/30/2014  Patient/family informed of [facility]' ownership interest in [facility], as well as of the fact that they are under no obligation to receive care at this facility.  PASARR submitted to EDS on  PASARR number received on   FL2 transmitted to all facilities in geographic area requested by pt/family on  11/30/2014 FL2 transmitted to all facilities within larger geographic area on   Patient informed that his/her managed care company has contracts with or will negotiate with  certain facilities, including the following:     Patient/family informed of bed offers received:  11/30/2014 Patient chooses bed at [facility] Physician recommends and patient chooses bed at    Patient to be transferred to [facility] on  12/02/2014 Patient to be transferred to facility by [facility] Patient and family notified of transfer on 12/02/2014 Name of family member notified:  Pt declined assistance.  The following physician request were entered in Epic:   Additional Comments: Pt  in agreement with d/c to SNF today. [facility] transport required. [employer] provided authorization for SNF . NSG reviewed d/c summary, scripts, avs.  Werner Lean LCSW [phone]

## 2014-12-02 NOTE — Progress Notes (Signed)
   Subjective: 3 Days Post-Op Procedure(s) (LRB): LEFT TOTAL KNEE ARTHROPLASTY (Left) Patient reports pain as mild.   Patient seen in rounds by Dr. Wynelle Link. Patient is well, and has had no acute complaints or problems Patient is ready to go to [facility]  Objective: Vital signs in last 24 hours: Temp:  [99 F (37.2 C)-99.9 F (37.7 C)] 99 F (37.2 C) (02/15 0520) Pulse Rate:  [73-82] 82 (02/15 0520) Resp:  [16] 16 (02/15 0520) BP: (115-147)/(59-90) 136/67 mmHg (02/15 0520) SpO2:  [95 %-96 %] 96 % (02/15 0520)  Intake/Output from previous day:  Intake/Output Summary (Last 24 hours) at 12/02/14 0708 Last data filed at 12/01/14 2000  Gross per 24 hour  Intake 1351.08 ml  Output   1050 ml  Net 301.08 ml   Labs:  Recent Labs  11/30/14 0500 12/01/14 0506 12/02/14 0515  HGB 11.2* 9.9* 9.9*    Recent Labs  12/01/14 0506 12/02/14 0515  WBC 12.0* 8.7  RBC 3.10* 3.08*  HCT 29.6* 29.5*  PLT 175 170    Recent Labs  11/30/14 0500 12/01/14 0506  NA 135 141  K 4.0 4.0  CL 102 108  CO2 27 29  BUN 13 16  CREATININE 0.66 0.66  GLUCOSE 149* 136*  CALCIUM 8.3* 8.7   No results for input(s): LABPT, INR in the last 72 hours.  EXAM: General - Patient is Alert, Appropriate and Oriented Extremity - Neurovascular intact Sensation intact distally Dorsiflexion/Plantar flexion intact Incision - clean, dry, no drainage Motor Function - intact, moving foot and toes well on exam.   Assessment/Plan: 3 Days Post-Op Procedure(s) (LRB): LEFT TOTAL KNEE ARTHROPLASTY (Left) Procedure(s) (LRB): LEFT TOTAL KNEE ARTHROPLASTY (Left) Past Medical History  Diagnosis Date  . Arthritis   . CHF (congestive heart failure)   . Glaucoma   . Post-nasal drip   . Osteoporosis   . History of stomach ulcers   . Incontinence   . Hx: UTI (urinary tract infection)   . Hypothyroidism   . Thyroid nodule   . Difficulty sleeping   . Anxiety    Principal Problem:   OA (osteoarthritis)  of knee  Estimated body mass index is 23.96 kg/(m^2) as calculated from the following:   Height as of this encounter: 5\' 5"  (1.651 m).   Weight as of this encounter: 65.318 kg (144 lb). Up with therapy Discharge to SNF Diet - Cardiac diet Follow up - in 2 weeks Activity - WBAT Disposition - SNF Condition Upon Discharge - Stable D/C Meds - See DC Summary DVT Prophylaxis - Xarelto  Maria Muslim, PA-C Orthopaedic Surgery 12/02/2014, 7:08 AM

## 2014-12-03 ENCOUNTER — Encounter (HOSPITAL_COMMUNITY): Payer: Self-pay | Admitting: Orthopedic Surgery

## 2014-12-06 ENCOUNTER — Non-Acute Institutional Stay (SKILLED_NURSING_FACILITY): Payer: Medicare Other | Admitting: Internal Medicine

## 2014-12-06 DIAGNOSIS — D62 Acute posthemorrhagic anemia: Secondary | ICD-10-CM | POA: Diagnosis not present

## 2014-12-06 DIAGNOSIS — I10 Essential (primary) hypertension: Secondary | ICD-10-CM

## 2014-12-06 DIAGNOSIS — K5900 Constipation, unspecified: Secondary | ICD-10-CM | POA: Diagnosis not present

## 2014-12-06 DIAGNOSIS — E039 Hypothyroidism, unspecified: Secondary | ICD-10-CM

## 2014-12-06 DIAGNOSIS — M1712 Unilateral primary osteoarthritis, left knee: Secondary | ICD-10-CM | POA: Diagnosis not present

## 2014-12-06 DIAGNOSIS — F411 Generalized anxiety disorder: Secondary | ICD-10-CM | POA: Diagnosis not present

## 2014-12-06 NOTE — Progress Notes (Signed)
Patient ID: Maria [name], female   DOB: [birth year], 81 y.o.   MRN: [id number]     [facility]   PCP: DURAN,MICHAEL R, PA-C   Allergies  Allergen Reactions  . Aspirin Other (See Comments)    Gastritis and stomach ulcers    Chief Complaint  Patient presents with  . New Admit To SNF     HPI:  81 year old patient is here for short term rehabilitation post hospital admission from 11/29/14-12/02/14 with left knee OA. She underwent left total knee arthroplasty.  She is seen in her room today. No concerns from patient or staff. She has PMH of hypothyroidism, HTN, anxiety  Review of Systems:  Constitutional: Negative for fever, chills, malaise/fatigue and diaphoresis.    Eyes: Negative for eye pain, blurred vision, double vision and discharge.  Respiratory: Negative for cough, shortness of breath and wheezing.   Cardiovascular: Negative for chest pain, palpitations, leg swelling.  Gastrointestinal: Negative for heartburn, nausea, vomiting, abdominal pain Genitourinary: Negative for dysuria Musculoskeletal: Negative for back pain, falls Skin: Negative for itching, rash.  Neurological: Negative for dizziness, tingling, focal weakness Psychiatric/Behavioral: Negative for depression.    Past Medical History  Diagnosis Date  . Arthritis   . CHF (congestive heart failure)   . Glaucoma   . Post-nasal drip   . Osteoporosis   . History of stomach ulcers   . Incontinence   . Hx: UTI (urinary tract infection)   . Hypothyroidism   . Thyroid nodule   . Difficulty sleeping   . Anxiety    Past Surgical History  Procedure Laterality Date  . Appendectomy    . Tubal ligation    . Tonsillectomy    . Joint replacement  2010 / 2011    bilateral Hip replacement   . Skin cancer excision    . Total knee arthroplasty Left 11/29/2014    Procedure: LEFT TOTAL KNEE ARTHROPLASTY;  Surgeon: Gearlean Alf, MD;  Location: [facility];  Service: Orthopedics;   Laterality: Left;   Social History:   reports that she has never smoked. She does not have any smokeless tobacco history on file. She reports that she drinks alcohol. She reports that she does not use illicit drugs.  Family History  Problem Relation Age of Onset  . Heart disease Mother   . Cancer Sister   . Cancer Brother   . Cancer Sister     Medications: Medication reviewed. See MAR   Physical Exam: Filed Vitals:   12/06/14 1216  BP: 110/59  Pulse: 70  Temp: 98.6 F (37 C)  Resp: 18  SpO2: 95%    General- elderly female, in no acute distress Head- normocephalic, atraumatic Throat- moist mucus membrane  Neck- no cervical lymphadenopathy Cardiovascular- normal s1,s2, no murmurs, palpable dorsalis pedis and radial pulses, no leg edema Respiratory- bilateral clear to auscultation, no wheeze, no rhonchi, no crackles, no use of accessory muscles Abdomen- bowel sounds present, soft, non tender Musculoskeletal- able to move all 4 extremities,left knee range of motion limited  Neurological- no focal deficit Skin- warm and dry, left knee surgical dressing in place, clean and dry Psychiatry- alert and oriented to person, place and time, normal mood and affect   Labs reviewed: Basic Metabolic Panel:  Recent Labs  11/21/14 1540 11/30/14 0500 12/01/14 0506  NA 141 135 141  K 4.1 4.0 4.0  CL 103 102 108  CO2 31 27 29   GLUCOSE 101* 149* 136*  BUN 22  13 16  CREATININE 1.00 0.66 0.66  CALCIUM 9.3 8.3* 8.7   Liver Function Tests:  Recent Labs  11/21/14 1540  AST 21  ALT 19  ALKPHOS 83  BILITOT 0.9  PROT 6.8  ALBUMIN 4.1   No results for input(s): LIPASE, AMYLASE in the last 8760 hours. No results for input(s): AMMONIA in the last 8760 hours. CBC:  Recent Labs  11/30/14 0500 12/01/14 0506 12/02/14 0515  WBC 13.3* 12.0* 8.7  HGB 11.2* 9.9* 9.9*  HCT 32.9* 29.6* 29.5*  MCV 93.7 95.5 95.8  PLT 171 175 170    Assessment/Plan  Left knee OA S/p left  knee arthroplasty. Continue robaxin 500 mg q6h prn for muscle spasm, oxyIR 5 mg 1-2 tab q3h prn for pain with tramadol 50 mg 1-2 tab q6h prn. Continue xarelto for dvt prophylaxis. Has f/u with orthopedics. To wear ted hose and WBAT. Will have her work with physical therapy and occupational therapy team to help with gait training and muscle strengthening exercises.fall precautions. Skin care. Encourage to be out of bed.   Anxiety Stable, continue xanax 0.25 mg tid  Constipation Continue dulcolax prn, colace 100 mg bid and prn miralax  Hypothyroidism Continue armour 30 mg daily  Blood loss anemia Stable h&h, monitor clinically  HTN bp stable, continue coreg 3.125 mg bid, lisinopril 5 mg daily   Goals of care: short term rehabilitation   Family/ staff Communication: reviewed care plan with patient and nursing supervisor    Blanchie Serve, MD  [place] [phone] (Monday-Friday 8 am - 5 pm) [phone] (afterhours)

## 2014-12-13 ENCOUNTER — Non-Acute Institutional Stay (SKILLED_NURSING_FACILITY): Payer: Medicare Other | Admitting: Adult Health

## 2014-12-13 ENCOUNTER — Encounter: Payer: Self-pay | Admitting: Adult Health

## 2014-12-13 DIAGNOSIS — I10 Essential (primary) hypertension: Secondary | ICD-10-CM

## 2014-12-13 DIAGNOSIS — K5900 Constipation, unspecified: Secondary | ICD-10-CM

## 2014-12-13 DIAGNOSIS — E039 Hypothyroidism, unspecified: Secondary | ICD-10-CM

## 2014-12-13 DIAGNOSIS — F419 Anxiety disorder, unspecified: Secondary | ICD-10-CM

## 2014-12-13 DIAGNOSIS — D62 Acute posthemorrhagic anemia: Secondary | ICD-10-CM

## 2014-12-13 DIAGNOSIS — M1712 Unilateral primary osteoarthritis, left knee: Secondary | ICD-10-CM

## 2014-12-13 NOTE — Progress Notes (Signed)
Patient ID: Maria [name], female   DOB: Emmilia 03, 1932, 81 y.o.   MRN: [id number]   12/13/2014  Facility:  [facility] Home Location:  [place] Room Number: 1204-P LEVEL OF CARE:  SNF (31)   Chief Complaint  Patient presents with  . Discharge Note    Osteoarthritis S/P left total knee arthroplasty, anxiety, hypertension, constipation, hypothyroidism and anemia    HISTORY OF PRESENT ILLNESS:  This is an 81 year old female who is for discharge home with home health PT for ambulation and old D for self-care skills and home management. DME:  3 in 1 commode. She has been admitted to [facility] on 12/02/14 from [facility] with osteoarthritis S/P left total knee arthroplasty. She has past medical history of CHF, hypothyroidism and anxiety.  Patient was admitted to this facility for short-term rehabilitation after the patient's recent hospitalization.  Patient has completed SNF rehabilitation and therapy has cleared the patient for discharge.  PAST MEDICAL HISTORY:  Past Medical History  Diagnosis Date  . Arthritis   . CHF (congestive heart failure)   . Glaucoma   . Post-nasal drip   . Osteoporosis   . History of stomach ulcers   . Incontinence   . Hx: UTI (urinary tract infection)   . Hypothyroidism   . Thyroid nodule   . Difficulty sleeping   . Anxiety     CURRENT MEDICATIONS: Reviewed per MAR/see medication list  Allergies  Allergen Reactions  . Aspirin Other (See Comments)    Gastritis and stomach ulcers     REVIEW OF SYSTEMS:  GENERAL: no change in appetite, no fatigue, no weight changes, no fever, chills or weakness RESPIRATORY: no cough, SOB, DOE, wheezing, hemoptysis CARDIAC: no chest pain, or palpitations GI: no abdominal pain, diarrhea, constipation, heart burn, nausea or vomiting  PHYSICAL EXAMINATION  GENERAL: no acute distress, normal body habitus EYES: conjunctivae normal, sclerae normal, normal eye lids NECK: supple,  trachea midline, no neck masses, no thyroid tenderness, no thyromegaly LYMPHATICS: no LAN in the neck, no supraclavicular LAN RESPIRATORY: breathing is even & unlabored, BS CTAB CARDIAC: RRR, no murmur,no extra heart sounds, LLE edema 1+ GI: abdomen soft, normal BS, no masses, no tenderness, no hepatomegaly, no splenomegaly EXTREMITIES: Able to move 4 extremities PSYCHIATRIC: the patient is alert & oriented to person, affect & behavior appropriate  LABS/RADIOLOGY: Labs reviewed: Basic Metabolic Panel:  Recent Labs  11/21/14 1540 11/30/14 0500 12/01/14 0506  NA 141 135 141  K 4.1 4.0 4.0  CL 103 102 108  CO2 31 27 29   GLUCOSE 101* 149* 136*  BUN 22 13 16   CREATININE 1.00 0.66 0.66  CALCIUM 9.3 8.3* 8.7   Liver Function Tests:  Recent Labs  11/21/14 1540  AST 21  ALT 19  ALKPHOS 83  BILITOT 0.9  PROT 6.8  ALBUMIN 4.1   CBC:  Recent Labs  11/30/14 0500 12/01/14 0506 12/02/14 0515  WBC 13.3* 12.0* 8.7  HGB 11.2* 9.9* 9.9*  HCT 32.9* 29.6* 29.5*  MCV 93.7 95.5 95.8  PLT 171 175 170     ASSESSMENT/PLAN:  Osteoarthritis S/P left total knee arthroplasty - for home health PT and OT; continue Ultram 50 mg 1-2 tabs by mouth every 6 hours when necessary and oxycodone 5 mg 1-2 tabs by mouth every 3 hours when necessary; will discontinue Xarelto upon discharge Anxiety - continue Xanax 0.25 mg by mouth 3 times a day Hypertension - well controlled; continue Coreg 3.125 mg by  mouth twice a day and lisinopril 5 mg by mouth daily Constipation - start Senokot S2 tabs by mouth twice a day and MiraLAX 17 g +4-6 ounces liquid by mouth twice a day Hypothyroidism - continue Armour 30 mg by mouth daily Anemia, acute blood loss - hemoglobin 9.9; stable   I have filled out patient's discharge paperwork and written prescriptions.  Patient will receive home health PT and OT.  DME provided:  3 in 1 commode  Total discharge time: Greater than 30 minutes  Discharge time involved  coordination of the discharge process with social worker, nursing staff and therapy department. Medical justification for home health services/DME verified.    [facility], NP [employer] [phone]

## 2015-10-01 ENCOUNTER — Other Ambulatory Visit: Payer: Self-pay | Admitting: Cardiology

## 2015-10-01 ENCOUNTER — Other Ambulatory Visit: Payer: Self-pay

## 2015-10-01 DIAGNOSIS — Z1231 Encounter for screening mammogram for malignant neoplasm of breast: Secondary | ICD-10-CM

## 2015-11-25 ENCOUNTER — Ambulatory Visit
Admission: RE | Admit: 2015-11-25 | Discharge: 2015-11-25 | Disposition: A | Discharge status: Home / Self Care | Payer: Medicare Other | Source: Ambulatory Visit | Attending: Cardiology | Admitting: Cardiology

## 2015-11-25 DIAGNOSIS — Z1231 Encounter for screening mammogram for malignant neoplasm of breast: Secondary | ICD-10-CM

## 2015-12-31 ENCOUNTER — Observation Stay (HOSPITAL_COMMUNITY)
Admission: EM | Admit: 2015-12-31 | Discharge: 2016-01-02 | Disposition: A | Discharge status: Home / Self Care | Payer: Medicare Other | Attending: Family Medicine | Admitting: Family Medicine

## 2015-12-31 ENCOUNTER — Emergency Department (HOSPITAL_COMMUNITY): Payer: Medicare Other

## 2015-12-31 ENCOUNTER — Encounter (HOSPITAL_COMMUNITY): Payer: Self-pay | Admitting: Emergency Medicine

## 2015-12-31 DIAGNOSIS — R079 Chest pain, unspecified: Secondary | ICD-10-CM | POA: Insufficient documentation

## 2015-12-31 DIAGNOSIS — E039 Hypothyroidism, unspecified: Secondary | ICD-10-CM | POA: Diagnosis not present

## 2015-12-31 DIAGNOSIS — F319 Bipolar disorder, unspecified: Secondary | ICD-10-CM | POA: Insufficient documentation

## 2015-12-31 DIAGNOSIS — S0181XA Laceration without foreign body of other part of head, initial encounter: Secondary | ICD-10-CM | POA: Diagnosis not present

## 2015-12-31 DIAGNOSIS — Z96643 Presence of artificial hip joint, bilateral: Secondary | ICD-10-CM | POA: Diagnosis not present

## 2015-12-31 DIAGNOSIS — E0590 Thyrotoxicosis, unspecified without thyrotoxic crisis or storm: Secondary | ICD-10-CM | POA: Insufficient documentation

## 2015-12-31 DIAGNOSIS — Z7901 Long term (current) use of anticoagulants: Secondary | ICD-10-CM | POA: Insufficient documentation

## 2015-12-31 DIAGNOSIS — Z85828 Personal history of other malignant neoplasm of skin: Secondary | ICD-10-CM | POA: Diagnosis not present

## 2015-12-31 DIAGNOSIS — I447 Left bundle-branch block, unspecified: Secondary | ICD-10-CM | POA: Diagnosis not present

## 2015-12-31 DIAGNOSIS — S0083XA Contusion of other part of head, initial encounter: Secondary | ICD-10-CM | POA: Insufficient documentation

## 2015-12-31 DIAGNOSIS — Z96652 Presence of left artificial knee joint: Secondary | ICD-10-CM | POA: Diagnosis not present

## 2015-12-31 DIAGNOSIS — N390 Urinary tract infection, site not specified: Secondary | ICD-10-CM | POA: Insufficient documentation

## 2015-12-31 DIAGNOSIS — K219 Gastro-esophageal reflux disease without esophagitis: Secondary | ICD-10-CM | POA: Diagnosis not present

## 2015-12-31 DIAGNOSIS — W19XXXA Unspecified fall, initial encounter: Secondary | ICD-10-CM | POA: Diagnosis not present

## 2015-12-31 DIAGNOSIS — F329 Major depressive disorder, single episode, unspecified: Secondary | ICD-10-CM | POA: Insufficient documentation

## 2015-12-31 DIAGNOSIS — I110 Hypertensive heart disease with heart failure: Secondary | ICD-10-CM | POA: Insufficient documentation

## 2015-12-31 DIAGNOSIS — R55 Syncope and collapse: Secondary | ICD-10-CM | POA: Diagnosis not present

## 2015-12-31 DIAGNOSIS — F0390 Unspecified dementia without behavioral disturbance: Secondary | ICD-10-CM

## 2015-12-31 DIAGNOSIS — I10 Essential (primary) hypertension: Secondary | ICD-10-CM | POA: Diagnosis not present

## 2015-12-31 DIAGNOSIS — I509 Heart failure, unspecified: Secondary | ICD-10-CM | POA: Diagnosis not present

## 2015-12-31 HISTORY — DX: Other complications of anesthesia, initial encounter: T88.59XA

## 2015-12-31 HISTORY — DX: Herpesviral infection of urogenital system, unspecified: A60.00

## 2015-12-31 HISTORY — DX: Adverse effect of unspecified anesthetic, initial encounter: T41.45XA

## 2015-12-31 HISTORY — DX: Anemia, unspecified: D64.9

## 2015-12-31 HISTORY — DX: Depression, unspecified: F32.A

## 2015-12-31 HISTORY — DX: Other chronic pain: G89.29

## 2015-12-31 HISTORY — DX: Gastritis, unspecified, without bleeding: K29.70

## 2015-12-31 HISTORY — DX: Major depressive disorder, single episode, unspecified: F32.9

## 2015-12-31 HISTORY — DX: Syncope and collapse: R55

## 2015-12-31 HISTORY — DX: Basal cell carcinoma of skin of scalp and neck: C44.41

## 2015-12-31 HISTORY — DX: Low back pain: M54.5

## 2015-12-31 HISTORY — DX: Low back pain, unspecified: M54.50

## 2015-12-31 HISTORY — DX: Essential (primary) hypertension: I10

## 2015-12-31 LAB — I-STAT TROPONIN, ED
TROPONIN I, POC: 0 ng/mL (ref 0.00–0.08)
Troponin i, poc: 0.01 ng/mL (ref 0.00–0.08)

## 2015-12-31 LAB — COMPREHENSIVE METABOLIC PANEL
ALK PHOS: 78 U/L (ref 38–126)
ALT: 13 U/L — AB (ref 14–54)
ANION GAP: 9 (ref 5–15)
AST: 22 U/L (ref 15–41)
Albumin: 3.7 g/dL (ref 3.5–5.0)
BUN: 14 mg/dL (ref 6–20)
CALCIUM: 9.3 mg/dL (ref 8.9–10.3)
CHLORIDE: 105 mmol/L (ref 101–111)
CO2: 27 mmol/L (ref 22–32)
CREATININE: 0.72 mg/dL (ref 0.44–1.00)
Glucose, Bld: 97 mg/dL (ref 65–99)
Potassium: 4.2 mmol/L (ref 3.5–5.1)
Sodium: 141 mmol/L (ref 135–145)
Total Bilirubin: 0.9 mg/dL (ref 0.3–1.2)
Total Protein: 6 g/dL — ABNORMAL LOW (ref 6.5–8.1)

## 2015-12-31 LAB — CBC WITH DIFFERENTIAL/PLATELET
Basophils Absolute: 0 10*3/uL (ref 0.0–0.1)
Basophils Relative: 0 %
EOS PCT: 2 %
Eosinophils Absolute: 0.2 10*3/uL (ref 0.0–0.7)
HCT: 42.9 % (ref 36.0–46.0)
Hemoglobin: 14.2 g/dL (ref 12.0–15.0)
LYMPHS ABS: 1.9 10*3/uL (ref 0.7–4.0)
LYMPHS PCT: 23 %
MCH: 30.1 pg (ref 26.0–34.0)
MCHC: 33.1 g/dL (ref 30.0–36.0)
MCV: 90.9 fL (ref 78.0–100.0)
MONOS PCT: 6 %
Monocytes Absolute: 0.5 10*3/uL (ref 0.1–1.0)
Neutro Abs: 5.9 10*3/uL (ref 1.7–7.7)
Neutrophils Relative %: 69 %
PLATELETS: 167 10*3/uL (ref 150–400)
RBC: 4.72 MIL/uL (ref 3.87–5.11)
RDW: 13.1 % (ref 11.5–15.5)
WBC: 8.5 10*3/uL (ref 4.0–10.5)

## 2015-12-31 LAB — URINE MICROSCOPIC-ADD ON

## 2015-12-31 LAB — URINALYSIS, ROUTINE W REFLEX MICROSCOPIC
Bilirubin Urine: NEGATIVE
GLUCOSE, UA: NEGATIVE mg/dL
KETONES UR: NEGATIVE mg/dL
Nitrite: NEGATIVE
PROTEIN: NEGATIVE mg/dL
Specific Gravity, Urine: 1.006 (ref 1.005–1.030)
pH: 6 (ref 5.0–8.0)

## 2015-12-31 MED ORDER — DEXTROSE 5 % IV SOLN
1.0000 g | Freq: Once | INTRAVENOUS | Status: AC
  Administered 2015-12-31: 1 g via INTRAVENOUS
  Filled 2015-12-31: qty 10

## 2015-12-31 MED ORDER — HYDROCODONE-ACETAMINOPHEN 5-325 MG PO TABS: 1.0000 | ORAL_TABLET | Freq: Four times a day (QID) | ORAL | Status: DC | PRN

## 2015-12-31 MED ORDER — LATANOPROST 0.005 % OP SOLN
1.0000 [drp] | Freq: Every morning | OPHTHALMIC | Status: DC
  Administered 2016-01-01: 1 [drp] via OPHTHALMIC
  Filled 2015-12-31 (×2): qty 2.5

## 2015-12-31 MED ORDER — SODIUM CHLORIDE 0.9% FLUSH
3.0000 mL | Freq: Two times a day (BID) | INTRAVENOUS | Status: DC
  Administered 2016-01-01 – 2016-01-02 (×2): 3 mL via INTRAVENOUS

## 2015-12-31 MED ORDER — ENOXAPARIN SODIUM 40 MG/0.4ML ~~LOC~~ SOLN
40.0000 mg | SUBCUTANEOUS | Status: DC
  Administered 2015-12-31 – 2016-01-01 (×2): 40 mg via SUBCUTANEOUS
  Filled 2015-12-31 (×2): qty 0.4

## 2015-12-31 MED ORDER — IPRATROPIUM BROMIDE 0.03 % NA SOLN
2.0000 | Freq: Every morning | NASAL | Status: DC
  Filled 2015-12-31 (×2): qty 30

## 2015-12-31 MED ORDER — LISINOPRIL 5 MG PO TABS
5.0000 mg | ORAL_TABLET | Freq: Every morning | ORAL | Status: DC
  Administered 2016-01-01: 5 mg via ORAL
  Filled 2015-12-31: qty 1

## 2015-12-31 MED ORDER — ESCITALOPRAM OXALATE 10 MG PO TABS: 10.0000 mg | ORAL_TABLET | Freq: Every day | ORAL | Status: DC

## 2015-12-31 MED ORDER — DEXTROSE 5 % IV SOLN
1.0000 g | INTRAVENOUS | Status: DC
  Administered 2016-01-01: 1 g via INTRAVENOUS
  Filled 2015-12-31 (×2): qty 10

## 2015-12-31 MED ORDER — LEVOTHYROXINE SODIUM 100 MCG PO TABS
100.0000 ug | ORAL_TABLET | Freq: Every day | ORAL | Status: DC
  Administered 2016-01-01: 100 ug via ORAL
  Filled 2015-12-31: qty 1

## 2015-12-31 MED ORDER — CARVEDILOL 3.125 MG PO TABS
3.1250 mg | ORAL_TABLET | Freq: Two times a day (BID) | ORAL | Status: DC
  Administered 2015-12-31 – 2016-01-02 (×4): 3.125 mg via ORAL
  Filled 2015-12-31 (×4): qty 1

## 2015-12-31 MED ORDER — ALPRAZOLAM 0.25 MG PO TABS
0.2500 mg | ORAL_TABLET | Freq: Three times a day (TID) | ORAL | Status: DC
  Administered 2015-12-31 – 2016-01-02 (×5): 0.25 mg via ORAL
  Filled 2015-12-31 (×5): qty 1

## 2015-12-31 MED ORDER — ACETAMINOPHEN 325 MG PO TABS
650.0000 mg | ORAL_TABLET | Freq: Four times a day (QID) | ORAL | Status: DC | PRN
  Administered 2015-12-31 – 2016-01-01 (×3): 650 mg via ORAL
  Filled 2015-12-31 (×3): qty 2

## 2015-12-31 MED ORDER — ISOSORBIDE MONONITRATE ER 30 MG PO TB24
30.0000 mg | ORAL_TABLET | Freq: Every day | ORAL | Status: DC
  Filled 2015-12-31: qty 1

## 2015-12-31 MED ORDER — ONDANSETRON HCL 4 MG/2ML IJ SOLN: 4.0000 mg | Freq: Four times a day (QID) | INTRAMUSCULAR | Status: DC | PRN

## 2015-12-31 MED ORDER — FLUTICASONE PROPIONATE 50 MCG/ACT NA SUSP
2.0000 | Freq: Every day | NASAL | Status: DC
  Filled 2015-12-31: qty 16

## 2015-12-31 MED ORDER — ACETAMINOPHEN 650 MG RE SUPP: 650.0000 mg | Freq: Four times a day (QID) | RECTAL | Status: DC | PRN

## 2015-12-31 MED ORDER — PANTOPRAZOLE SODIUM 40 MG PO TBEC
40.0000 mg | DELAYED_RELEASE_TABLET | Freq: Every day | ORAL | Status: DC
  Administered 2016-01-01 – 2016-01-02 (×2): 40 mg via ORAL
  Filled 2015-12-31 (×2): qty 1

## 2015-12-31 MED ORDER — TRAMADOL HCL 50 MG PO TABS: 50.0000 mg | ORAL_TABLET | Freq: Four times a day (QID) | ORAL | Status: DC | PRN

## 2015-12-31 MED ORDER — IOHEXOL 350 MG/ML SOLN
75.0000 mL | Freq: Once | INTRAVENOUS | Status: AC | PRN
  Administered 2015-12-31: 100 mL via INTRAVENOUS

## 2015-12-31 MED ORDER — BISACODYL 10 MG RE SUPP: 10.0000 mg | Freq: Every day | RECTAL | Status: DC | PRN

## 2015-12-31 MED ORDER — ONDANSETRON HCL 4 MG PO TABS: 4.0000 mg | ORAL_TABLET | Freq: Four times a day (QID) | ORAL | Status: DC | PRN

## 2015-12-31 NOTE — ED Notes (Signed)
Cleansed pt's wound above right eye and applied skin glue per verbal order

## 2015-12-31 NOTE — H&P (Addendum)
[facility] History and Physical  Kadee W Nobbe [id number] DOB: Mar 07, 1931 DOA: 12/31/2015  Referring physician: ED PCP: Isaias Cowman, PA-C   Chief Complaint: Fall  HPI:  Ms. Halpern is a 82 year old female with a past medical history significant for anxiety, hypothyroidism, arthritis, s/p L. 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 History is obtained per review of records in part and discussions with the patient. When she was found it was report that she was initially confused and unable to answer questions appropriately for EMS. Complaining of pain in her left wrist and right lower rib. Patient recalls she was heading out to the grocery store before her niece came to visit. The next thing she recalls is being asked questions by the police/ medics, but does not recall if she was laying on the ground or in the stretcher. She describes similar episodes within the last year where she has no recollection of periods of time. 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 Also complaining of memory issues and lack of interest in things that she once did. Reports loss of her creative side. Furthermore, she notes history falling secondary to tripping over objects in the past, but she normally had complete recollection of the events that led to the falls. Only complaints that she may have had a urinary tract infection has she has been awakening to Amber/brown stained patents in the morning. Denies any recent fever, chills, nausea, vomiting, chest pain, or diarrhea. Patient notes not using any assistive device to ambulate, but notes issues with weakness in the right knee.  Upon admission patient is evaluated with a x-rays and CT of the brain/chest which showed no acute abnormalities. Initial lab work was unremarkable except for  which was positive for many bacteria, small amount of hbg, large leukocyte Estrace, negative nitrite, and 6-30 WBCs.  Review of Systems  Constitutional: Negative for malaise/fatigue and diaphoresis.  HENT: Negative for ear discharge and hearing loss.   Eyes: Positive for pain. Negative for photophobia.  Respiratory: Negative for cough, sputum production and shortness of breath.   Cardiovascular: Positive for palpitations (A couple weeks ago that resolved with laying back in a recliner). Negative for orthopnea and leg swelling.  Gastrointestinal: Negative for nausea, vomiting and abdominal pain.  Genitourinary: Negative for frequency and hematuria.       Change in urine color  Musculoskeletal: Positive for joint pain. Negative for falls and neck pain.  Skin: Negative for itching and rash.  Neurological: Positive for loss of consciousness and headaches. Negative for speech change and focal weakness.  Endo/Heme/Allergies: Positive for environmental allergies. Negative for polydipsia. Bruises/bleeds easily.  Psychiatric/Behavioral: Positive for depression (anhedonia) and memory loss. Negative for hallucinations. The patient is nervous/anxious.       Past Medical History  Diagnosis Date  . Arthritis   . CHF (congestive heart failure) ([place])   . Glaucoma   . Post-nasal drip   . Osteoporosis   . History of stomach ulcers   . Incontinence   . Hx: UTI (urinary tract infection)   . Hypothyroidism   . Thyroid nodule   . Difficulty sleeping   . Anxiety      Past Surgical History  Procedure Laterality Date  . Appendectomy    . Tubal ligation    . Tonsillectomy    .  Joint replacement  2010 / 2011    bilateral Hip replacement   . Skin cancer excision    . Total knee arthroplasty Left 11/29/2014    Procedure: LEFT TOTAL KNEE ARTHROPLASTY;  Surgeon: Gearlean Alf, MD;  Location: [facility];  Service: Orthopedics;  Laterality: Left;      Social History:  reports that she has never  smoked. She does not have any smokeless tobacco history on file. She reports that she drinks alcohol. She reports that she does not use illicit drugs. Where does patient live--home   and with whom if at home?Alone Can patient participate in ADLs? Yes  Allergies  Allergen Reactions  . Aspirin Other (See Comments)    Gastritis and stomach ulcers    Family History  Problem Relation Age of Onset  . Heart disease Mother   . Cancer Sister   . Cancer Brother   . Cancer Sister        Prior to Admission medications   Medication Sig Start Date End Date Taking? Authorizing Provider  ALPRAZolam (XANAX) 0.25 MG tablet Take 0.25 mg by mouth 3 (three) times daily.   Yes Historical Provider, MD  carvedilol (COREG) 3.125 MG tablet Take 3.125 mg by mouth 2 (two) times daily with a meal.   Yes Historical Provider, MD  isosorbide mononitrate (IMDUR) 30 MG 24 hr tablet Take 30 mg by mouth daily. 11/28/15  Yes Historical Provider, MD  latanoprost (XALATAN) 0.005 % ophthalmic solution Place 1 drop into both eyes every morning.   Yes Historical Provider, MD  levothyroxine (SYNTHROID, LEVOTHROID) 100 MCG tablet Take 100 mcg by mouth daily. 12/21/15  Yes Historical Provider, MD  lisinopril (PRINIVIL,ZESTRIL) 5 MG tablet Take 5 mg by mouth every morning.    Yes Historical Provider, MD  Omega-3 Fatty Acids (OMEGA-3 FISH OIL PO) Take 650 mg by mouth 2 (two) times daily.   Yes Historical Provider, MD  omeprazole (PRILOSEC) 40 MG capsule Take 40 mg by mouth every evening. 12/14/15  Yes Historical Provider, MD  acetaminophen (TYLENOL) 650 MG CR tablet Take 650 mg by mouth every 8 (eight) hours as needed for pain.     Historical Provider, MD  acyclovir ointment (ZOVIRAX) 5 % Apply 1 application topically daily as needed (for outbreaks).     Historical Provider, MD  bisacodyl (DULCOLAX) 10 MG suppository Place 1 suppository (10 mg total) rectally daily as needed for moderate constipation. 12/02/14   Arlee Muslim, PA-C   docusate sodium (COLACE) 100 MG capsule Take 1 capsule (100 mg total) by mouth 2 (two) times daily. 12/02/14   Arlee Muslim, PA-C  escitalopram (LEXAPRO) 10 MG tablet Take 10 mg by mouth daily. 12/14/15   Historical Provider, MD  fluticasone (FLONASE) 50 MCG/ACT nasal spray Place 2 sprays into both nostrils daily.  12/05/15   Historical Provider, MD  ipratropium (ATROVENT) 0.03 % nasal spray Place 2 sprays into both nostrils every morning.    Historical Provider, MD  methocarbamol (ROBAXIN) 500 MG tablet Take 1 tablet (500 mg total) by mouth every 6 (six) hours as needed for muscle spasms. 12/02/14   Arlee Muslim, PA-C  metoCLOPramide (REGLAN) 5 MG tablet Take 1 tablet (5 mg total) by mouth every 8 (eight) hours as needed for nausea (if ondansetron (ZOFRAN) ineffective.). 12/02/14   Arlee Muslim, PA-C  ondansetron (ZOFRAN) 4 MG tablet Take 1 tablet (4 mg total) by mouth every 6 (six) hours as needed for nausea. 12/02/14   Arlee Muslim, PA-C  oxyCODONE (OXY  IR/ROXICODONE) 5 MG immediate release tablet Take 1-2 tablets (5-10 mg total) by mouth every 3 (three) hours as needed for moderate pain, severe pain or breakthrough pain. 12/02/14   Arlee Muslim, PA-C  polyethylene glycol [facility] / GLYCOLAX) packet Take 17 g by mouth daily as needed for mild constipation. 12/02/14   Arlee Muslim, PA-C  prednisoLONE acetate (PRED FORTE) 1 % ophthalmic suspension Place 1 drop into both eyes See admin instructions. 1 drop three times a day for 1 week, 1 drop two times a day for 1 week, then 1 drop one time a day for 1 week.    Historical Provider, MD  PROLIA 60 MG/ML SOLN injection Inject 60 mg into the skin every 6 (six) months.  10/09/15   Historical Provider, MD  rivaroxaban (XARELTO) 10 MG TABS tablet Take 1 tablet (10 mg total) by mouth daily with breakfast. Take Xarelto for two and a half more weeks, then discontinue Xarelto. Once the patient has completed the blood thinner regimen, then take a Baby 81 mg Aspirin daily  for three more weeks. 12/02/14   Arlee Muslim, PA-C  thyroid (ARMOUR) 30 MG tablet Take 30 mg by mouth every morning.    Historical Provider, MD  traMADol (ULTRAM) 50 MG tablet Take 1-2 tablets (50-100 mg total) by mouth every 6 (six) hours as needed (mild pain). 12/02/14   Arlee Muslim, PA-C     Physical Exam: Filed Vitals:   12/31/15 1915 12/31/15 1930 12/31/15 2000 12/31/15 2015  BP: 155/93 135/76 92/51 119/74  Pulse: 83 77 68 75  Temp:      TempSrc:      Resp: 21 18 22 20   Height:      SpO2: 97% 96% 94% 97%     Constitutional: Vital signs reviewed. Patient is a well-developed and well-nourished in no acute distress and cooperative with exam. Alert and oriented x3.  Head: Normocephalic and atraumatic  Ear: TM normal bilaterally  Mouth: no erythema or exudates, MMM  Eyes: PERRL, EOMI, conjunctivae normal, No scleral icterus.  Neck: Supple, Trachea midline normal ROM, No JVD, mass, thyromegaly, or carotid bruit present.  Cardiovascular: RRR, S1 normal, S2 normal, no MRG, pulses symmetric and intact bilaterally  Pulmonary/Chest: CTAB, no wheezes, rales, or rhonchi  Abdominal: Soft. Non-tender, non-distended, bowel sounds are normal, no masses, organomegaly, or guarding present.  GU: no CVA tenderness Musculoskeletal: No joint deformities, erythema, or stiffness, ROM full and no nontender Ext: no edema and no cyanosis, pulses palpable bilaterally (DP and PT)  Hematology: no cervical, inginal, or axillary adenopathy.  Neurological: A&O x3, Strenght is normal and symmetric bilaterally, cranial nerve II-XII are grossly intact, no focal motor deficit, sensory intact to light touch bilaterally.  Skin: 2.5 cm laceration to the left brow with Dermabond placed Psychiatric: Normal mood and affect. speech and behavior is normal. Judgment and thought content normal. Cognition and memory are normal.      Data Review   Micro Results No results found for this or any previous visit (from the  past 240 hour(s)).  Radiology Reports Dg Chest 1 View  12/31/2015  CLINICAL DATA:  Fall today, central chest pain, pain under RIGHT breast EXAM: CHEST 1 VIEW COMPARISON:  07/30/2010 FINDINGS: Enlargement of cardiac silhouette. Mediastinal contours and pulmonary vascularity normal. Mild atherosclerotic calcification aorta. Bronchitic changes with bibasilar atelectasis. Remaining lungs clear. No pleural effusion or pneumothorax. Bones demineralized with BILATERAL glenohumeral degenerative changes. IMPRESSION: Enlargement of cardiac silhouette. Bronchitic changes with bibasilar atelectasis. Electronically Signed  By: Lavonia Dana M.D.   On: 12/31/2015 16:12   Dg Forearm Left  12/31/2015  CLINICAL DATA:  [place] today, lateral wrist and forearm pain EXAM: LEFT FOREARM - 2 VIEW COMPARISON:  None FINDINGS: Radiocarpal joint space narrowing. Diffuse osseous demineralization. Elbow joint spaces preserved. No acute fracture, dislocation or bone destruction. Additional degenerative changes at first [facility] joint and at distal pole of scaphoid. Minimal chondrocalcinosis. IMPRESSION: Osseous demineralization with degenerative changes and question CPPD at the carpus. No acute abnormalities. Electronically Signed   By: Lavonia Dana M.D.   On: 12/31/2015 16:16   Dg Wrist Complete Left  12/31/2015  CLINICAL DATA:  Pain following fall EXAM: LEFT WRIST - COMPLETE 3+ VIEW COMPARISON:  None. FINDINGS: Frontal, oblique, lateral, and ulnar deviation scaphoid images were obtained. There is no demonstrable fracture or dislocation. Bones are diffusely osteoporotic. There is moderate narrowing of the radiocarpal joint with calcification in the radiocarpal joint region. There is moderately severe osteoarthritic change in the scaphotrapezial and first carpal -metacarpal joints. No erosive change. IMPRESSION: Bones osteoporotic. Multifocal osteoarthritic change. No acute fracture or dislocation. Electronically Signed   By: Lowella Grip  III M.D.   On: 12/31/2015 16:21   Ct Head Wo Contrast  12/31/2015  CLINICAL DATA:  Episode of syncope with fall, hit head right frontal region, neck injury. EXAM: CT HEAD WITHOUT CONTRAST CT CERVICAL SPINE WITHOUT CONTRAST TECHNIQUE: Multidetector CT imaging of the head and cervical spine was performed following the standard protocol without intravenous contrast. Multiplanar CT image reconstructions of the cervical spine were also generated. COMPARISON:  Head CT dated 01/15/2008. FINDINGS: CT HEAD FINDINGS Again noted is a mild generalized brain atrophy with commensurate dilatation of the ventricles and sulci. Ventricles are stable in size and configuration. Mild chronic small vessel ischemic change again noted within the bilateral periventricular white matter regions. Incidental note made of a prominent benign perivascular space within the lower right basal ganglia region. There is no mass, hemorrhage, edema or other evidence of acute parenchymal abnormality. No extra-axial hemorrhage. No osseous fracture or dislocation seen. Soft tissue edema noted over the right lower frontal bone. No underlying fracture. Both orbital globes appear normal in position and configuration. No retro-orbital hemorrhage or edema. CT CERVICAL SPINE FINDINGS Degenerative change noted throughout the cervical spine, mild to moderate in degree, with associated disc space narrowings and osseous spurring. Straightening of the normal cervical lordosis is likely related to these underlying degenerative changes. Minimal retrolisthesis of C5 and C6 also likely related to these underlying degenerative changes. There is no fracture line or displaced fracture fragment identified. Facet joints are normally aligned throughout. Atherosclerotic calcifications noted at the bilateral carotid bulb regions, left greater than right. Paravertebral soft tissues otherwise unremarkable. IMPRESSION: 1. Soft tissue swelling/edema overlying the lower right  frontal bone. No underlying fracture. 2. No acute intracranial abnormality. No intracranial mass, hemorrhage or edema. 3. No fracture or acute subluxation within the cervical spine. Degenerative changes throughout the cervical spine, as described above. Electronically Signed   By: Franki Cabot M.D.   On: 12/31/2015 17:58   Ct Angio Chest Pe W/cm &/or Wo Cm  12/31/2015  CLINICAL DATA:  Recent syncopal episode EXAM: CT ANGIOGRAPHY CHEST WITH CONTRAST TECHNIQUE: Multidetector CT imaging of the chest was performed using the standard protocol during bolus administration of intravenous contrast. Multiplanar CT image reconstructions and MIPs were obtained to evaluate the vascular anatomy. CONTRAST:  166mL OMNIPAQUE IOHEXOL 350 MG/ML SOLN COMPARISON:  None. FINDINGS: The lungs are  well aerated bilaterally. Some dependent atelectatic changes are noted. The thoracic inlet is within normal limits. The thoracic aorta is calcified but it does not appear to be aneurysmally dilated. No significant contrast enhancement to allow for dissection evaluation is noted. The pulmonary artery demonstrates a normal branching pattern. No evidence of pulmonary embolism is noted. No significant hilar or mediastinal adenopathy is noted. The visualized upper abdomen shows no acute abnormality. The osseous structures are within normal limits. Review of the MIP images confirms the above findings. IMPRESSION: No evidence of pulmonary emboli. Mild dependent atelectatic changes. Electronically Signed   By: Inez Catalina M.D.   On: 12/31/2015 17:55   Ct Cervical Spine Wo Contrast  12/31/2015  CLINICAL DATA:  Episode of syncope with fall, hit head right frontal region, neck injury. EXAM: CT HEAD WITHOUT CONTRAST CT CERVICAL SPINE WITHOUT CONTRAST TECHNIQUE: Multidetector CT imaging of the head and cervical spine was performed following the standard protocol without intravenous contrast. Multiplanar CT image reconstructions of the cervical spine  were also generated. COMPARISON:  Head CT dated 01/15/2008. FINDINGS: CT HEAD FINDINGS Again noted is a mild generalized brain atrophy with commensurate dilatation of the ventricles and sulci. Ventricles are stable in size and configuration. Mild chronic small vessel ischemic change again noted within the bilateral periventricular white matter regions. Incidental note made of a prominent benign perivascular space within the lower right basal ganglia region. There is no mass, hemorrhage, edema or other evidence of acute parenchymal abnormality. No extra-axial hemorrhage. No osseous fracture or dislocation seen. Soft tissue edema noted over the right lower frontal bone. No underlying fracture. Both orbital globes appear normal in position and configuration. No retro-orbital hemorrhage or edema. CT CERVICAL SPINE FINDINGS Degenerative change noted throughout the cervical spine, mild to moderate in degree, with associated disc space narrowings and osseous spurring. Straightening of the normal cervical lordosis is likely related to these underlying degenerative changes. Minimal retrolisthesis of C5 and C6 also likely related to these underlying degenerative changes. There is no fracture line or displaced fracture fragment identified. Facet joints are normally aligned throughout. Atherosclerotic calcifications noted at the bilateral carotid bulb regions, left greater than right. Paravertebral soft tissues otherwise unremarkable. IMPRESSION: 1. Soft tissue swelling/edema overlying the lower right frontal bone. No underlying fracture. 2. No acute intracranial abnormality. No intracranial mass, hemorrhage or edema. 3. No fracture or acute subluxation within the cervical spine. Degenerative changes throughout the cervical spine, as described above. Electronically Signed   By: Franki Cabot M.D.   On: 12/31/2015 17:58     CBC  Recent Labs Lab 12/31/15 1525  WBC 8.5  HGB 14.2  HCT 42.9  PLT 167  MCV 90.9  MCH 30.1   MCHC 33.1  RDW 13.1  LYMPHSABS 1.9  MONOABS 0.5  EOSABS 0.2  BASOSABS 0.0    Chemistries   Recent Labs Lab 12/31/15 1525  NA 141  K 4.2  CL 105  CO2 27  GLUCOSE 97  BUN 14  CREATININE 0.72  CALCIUM 9.3  AST 22  ALT 13*  ALKPHOS 78  BILITOT 0.9   ------------------------------------------------------------------------------------------------------------------ CrCl cannot be calculated (Unknown ideal weight.). ------------------------------------------------------------------------------------------------------------------ No results for input(s): HGBA1C in the last 72 hours. ------------------------------------------------------------------------------------------------------------------ No results for input(s): CHOL, HDL, LDLCALC, TRIG, CHOLHDL, LDLDIRECT in the last 72 hours. ------------------------------------------------------------------------------------------------------------------ No results for input(s): TSH, T4TOTAL, T3FREE, THYROIDAB in the last 72 hours.  Invalid input(s): FREET3 ------------------------------------------------------------------------------------------------------------------ No results for input(s): VITAMINB12, FOLATE, FERRITIN, TIBC, IRON, RETICCTPCT in the last 72  hours.  Coagulation profile No results for input(s): INR, PROTIME in the last 168 hours.  No results for input(s): DDIMER in the last 72 hours.  Cardiac Enzymes No results for input(s): CKMB, TROPONINI, MYOGLOBIN in the last 168 hours.  Invalid input(s): CK ------------------------------------------------------------------------------------------------------------------ Invalid input(s): POCBNP   CBG: No results for input(s): GLUCAP in the last 168 hours.     EKG: Independently reviewed. Sinus rhythm with a left bundle branch block   Assessment/Plan Fall: Acute. Patient with no recollection of events surrounding the fall. No reports of tongue biting, loss of  bowel, or bladder noted. Patient reportedly confused some minutes after as though postictal. Initial imagings studies including CT of chest and head as well as x-rays showed no acute fractures. Differential includes mechanical fall secondary to leg weakness versus syncope (? Cardiac) versus polypharmacy versus seizure no previous history. - Admit to a telemetry bed - Check orthostatic vital signs - Check UDS - Follow-up telemetry overnight - Physical therapy to eval and treat  - Patient gives history of palpitations week ago may benefit from being set up with a Holter monitor  Urinary tract infection: Acute. Patient's only symptoms including change in color of urine. - Follow-up urine culture  - Continue Rocephin for now and change to PO when able  Contusion head with Laceration to right brow: 2.5 cm in length laceration to the right upper brow repaired with dermabond. - Continue to monitor - Cool compresses to decrease swelling  Left bundle branch block: No previous EKG to compare available. Initial troponins negative - Have searched her chart, but cannot find previous EKG to compare  Congestive heart failure: Patient shows no signs of being an acute heart failure at this time. Last echo on file in 07/2010 with EF of 55% and signs of LVH. - Continue isosorbide mononitrate, Coreg, and lisinopril  Essential hypertension: Stable. - Continue medications as seen above  Hypothyroidism - Check  TSH  - Continue levothyroxine  Suspected Depression: Patient also notes anhedonia Patient was previously on Lexapro but unsure if or why this medication may have been stopped. - May benefit from restarting on antidepressant   Code Status:   full Family Communication: bedside Disposition Plan: admit   Total time spent 55 minutes.Greater than 50% of this time was spent in counseling, explanation of diagnosis, planning of further management, and coordination of care  [facility] Pager [phone]  If 7PM-7AM, please contact night-coverage [email or web address] Password [facility] 12/31/2015, 8:51 PM

## 2015-12-31 NOTE — ED Notes (Signed)
Report given to rn on the 5th floor

## 2015-12-31 NOTE — ED Notes (Signed)
Pt found by police officer and bystanders. Pt was laying in street outside of her house. Pt has head lac to right eyebrow. Pt does not recall the event. Pt has been confused, and unable to answer questions appropriately for EMS. Pt complaining L wrist pain and right lower rib pain. No deformities per EMS. Pt lives at home alone. Pt is not on blood thinners. BP 170/100 (hx of HTN), HR 60 , 100% on room air. CBG 119.

## 2015-12-31 NOTE — ED Notes (Signed)
Pt remains alert no distress 

## 2015-12-31 NOTE — ED Provider Notes (Signed)
CSN: [id number]     Arrival date & time 12/31/15  1501 History   First MD Initiated Contact with Patient 12/31/15 1507     Chief Complaint  Patient presents with  . Fall     (Consider location/radiation/quality/duration/timing/severity/associated sxs/prior Treatment) Patient is a 82 y.o. female presenting with syncope.  Loss of Consciousness Episode history:  Single Most recent episode:  Today Duration: unknown. Timing:  Constant Progression:  Resolved Chronicity:  New (one prior episode after knee surg) Associated symptoms: chest pain (subst aching, tenderness), headaches (around area of head trauma) and recent fall (with episode of syncope)   Associated symptoms: no diaphoresis, no difficulty breathing, no fever, no focal weakness, no nausea, no recent surgery, no seizures (not known, initially confused when neighbors and EMS arrived, alert oreinted on arrival to ED, no tongue biting, no loss bowel or bladder), no shortness of breath, no vomiting and no weakness     Past Medical History  Diagnosis Date  . Arthritis   . CHF (congestive heart failure) ([place])   . Glaucoma   . Post-nasal drip   . Osteoporosis   . History of stomach ulcers   . Incontinence   . Hx: UTI (urinary tract infection)   . Hypothyroidism   . Thyroid nodule   . Difficulty sleeping   . Anxiety    Past Surgical History  Procedure Laterality Date  . Appendectomy    . Tubal ligation    . Tonsillectomy    . Joint replacement  2010 / 2011    bilateral Hip replacement   . Skin cancer excision    . Total knee arthroplasty Left 11/29/2014    Procedure: LEFT TOTAL KNEE ARTHROPLASTY;  Surgeon: Gearlean Alf, MD;  Location: [facility];  Service: Orthopedics;  Laterality: Left;   Family History  Problem Relation Age of Onset  . Heart disease Mother   . Cancer Sister   . Cancer Brother   . Cancer Sister    Social History  Substance Use Topics  . Smoking status: Never Smoker   . Smokeless tobacco: None  .  Alcohol Use: Yes     Comment: 2 glasses wine per day   OB History    No data available     Review of Systems  Constitutional: Negative for fever and diaphoresis.  HENT: Negative for sore throat.   Eyes: Negative for visual disturbance.  Respiratory: Negative for cough and shortness of breath.   Cardiovascular: Positive for chest pain (subst aching, tenderness) and syncope.  Gastrointestinal: Negative for nausea, vomiting and abdominal pain.  Genitourinary: Negative for difficulty urinating.  Musculoskeletal: Positive for neck pain. Negative for back pain.  Skin: Positive for wound. Negative for rash.  Neurological: Positive for headaches (around area of head trauma). Negative for focal weakness, seizures (not known, initially confused when neighbors and EMS arrived, alert oreinted on arrival to ED, no tongue biting, no loss bowel or bladder), syncope and weakness.      Allergies  Aspirin  Home Medications   Prior to Admission medications   Medication Sig Start Date End Date Taking? Authorizing Provider  ALPRAZolam (XANAX) 0.25 MG tablet Take 0.25 mg by mouth 3 (three) times daily.   Yes Historical Provider, MD  carvedilol (COREG) 3.125 MG tablet Take 3.125 mg by mouth 2 (two) times daily with a meal.   Yes Historical Provider, MD  isosorbide mononitrate (IMDUR) 30 MG 24 hr tablet Take 30 mg by mouth daily. 11/28/15  Yes Historical Provider, MD  latanoprost (XALATAN) 0.005 % ophthalmic solution Place 1 drop into both eyes every morning.   Yes Historical Provider, MD  levothyroxine (SYNTHROID, LEVOTHROID) 100 MCG tablet Take 100 mcg by mouth daily. 12/21/15  Yes Historical Provider, MD  lisinopril (PRINIVIL,ZESTRIL) 5 MG tablet Take 5 mg by mouth every morning.    Yes Historical Provider, MD  Omega-3 Fatty Acids (OMEGA-3 FISH OIL PO) Take 650 mg by mouth 2 (two) times daily.   Yes Historical Provider, MD  omeprazole (PRILOSEC) 40 MG capsule Take 40 mg by mouth every evening. 12/14/15   Yes Historical Provider, MD  acetaminophen (TYLENOL) 650 MG CR tablet Take 650 mg by mouth every 8 (eight) hours as needed for pain.     Historical Provider, MD  acyclovir ointment (ZOVIRAX) 5 % Apply 1 application topically daily as needed (for outbreaks).     Historical Provider, MD  bisacodyl (DULCOLAX) 10 MG suppository Place 1 suppository (10 mg total) rectally daily as needed for moderate constipation. 12/02/14   Arlee Muslim, PA-C  docusate sodium (COLACE) 100 MG capsule Take 1 capsule (100 mg total) by mouth 2 (two) times daily. 12/02/14   Arlee Muslim, PA-C  escitalopram (LEXAPRO) 10 MG tablet Take 10 mg by mouth daily. 12/14/15   Historical Provider, MD  fluticasone (FLONASE) 50 MCG/ACT nasal spray Place 2 sprays into both nostrils daily.  12/05/15   Historical Provider, MD  ipratropium (ATROVENT) 0.03 % nasal spray Place 2 sprays into both nostrils every morning.    Historical Provider, MD  methocarbamol (ROBAXIN) 500 MG tablet Take 1 tablet (500 mg total) by mouth every 6 (six) hours as needed for muscle spasms. 12/02/14   Arlee Muslim, PA-C  metoCLOPramide (REGLAN) 5 MG tablet Take 1 tablet (5 mg total) by mouth every 8 (eight) hours as needed for nausea (if ondansetron (ZOFRAN) ineffective.). 12/02/14   Arlee Muslim, PA-C  ondansetron (ZOFRAN) 4 MG tablet Take 1 tablet (4 mg total) by mouth every 6 (six) hours as needed for nausea. 12/02/14   Arlee Muslim, PA-C  oxyCODONE (OXY IR/ROXICODONE) 5 MG immediate release tablet Take 1-2 tablets (5-10 mg total) by mouth every 3 (three) hours as needed for moderate pain, severe pain or breakthrough pain. 12/02/14   Arlee Muslim, PA-C  polyethylene glycol [facility] / GLYCOLAX) packet Take 17 g by mouth daily as needed for mild constipation. 12/02/14   Arlee Muslim, PA-C  prednisoLONE acetate (PRED FORTE) 1 % ophthalmic suspension Place 1 drop into both eyes See admin instructions. 1 drop three times a day for 1 week, 1 drop two times a day for 1 week, then 1  drop one time a day for 1 week.    Historical Provider, MD  PROLIA 60 MG/ML SOLN injection Inject 60 mg into the skin every 6 (six) months.  10/09/15   Historical Provider, MD  rivaroxaban (XARELTO) 10 MG TABS tablet Take 1 tablet (10 mg total) by mouth daily with breakfast. Take Xarelto for two and a half more weeks, then discontinue Xarelto. Once the patient has completed the blood thinner regimen, then take a Baby 81 mg Aspirin daily for three more weeks. 12/02/14   Arlee Muslim, PA-C  thyroid (ARMOUR) 30 MG tablet Take 30 mg by mouth every morning.    Historical Provider, MD  traMADol (ULTRAM) 50 MG tablet Take 1-2 tablets (50-100 mg total) by mouth every 6 (six) hours as needed (mild pain). 12/02/14   Arlee Muslim, PA-C   BP 171/64 mmHg  Pulse 84  Temp(Src) 99.3 F (37.4 C) (Oral)  Resp 18  Ht 5\' 5"  (1.651 m)  SpO2 97% Physical Exam  Constitutional: She is oriented to person, place, and time. She appears well-developed and well-nourished. No distress.  HENT:  Head: Normocephalic.  2cm superficial laceration superior to right eyebrow, hemostatic Contusion right supraorbital  Eyes: Conjunctivae and EOM are normal.  Neck: Normal range of motion.  Cardiovascular: Normal rate, regular rhythm, normal heart sounds and intact distal pulses.  Exam reveals no gallop and no friction rub.   No murmur heard. Pulmonary/Chest: Effort normal and breath sounds normal. No respiratory distress. She has no wheezes. She has no rales. She exhibits tenderness.  Abdominal: Soft. She exhibits no distension. There is no tenderness. There is no guarding.  Musculoskeletal: She exhibits no edema.       Cervical back: She exhibits tenderness and bony tenderness.  Neurological: She is alert and oriented to person, place, and time. She has normal strength. No cranial nerve deficit or sensory deficit. GCS eye subscore is 4. GCS verbal subscore is 5. GCS motor subscore is 6.  Skin: Skin is warm and dry. Laceration  (2cm facial laceration) noted. No rash noted. She is not diaphoretic. No erythema.  Nursing note and vitals reviewed.   ED Course  Procedures (including critical care time) Labs Review Labs Reviewed  COMPREHENSIVE METABOLIC PANEL - Abnormal; Notable for the following:    Total Protein 6.0 (*)    ALT 13 (*)    All other components within normal limits  URINALYSIS, ROUTINE W REFLEX MICROSCOPIC (NOT AT [facility]) - Abnormal; Notable for the following:    APPearance CLOUDY (*)    Hgb urine dipstick SMALL (*)    Leukocytes, UA LARGE (*)    All other components within normal limits  URINE MICROSCOPIC-ADD ON - Abnormal; Notable for the following:    Squamous Epithelial / LPF 0-5 (*)    Bacteria, UA MANY (*)    All other components within normal limits  URINE CULTURE  CBC WITH DIFFERENTIAL/PLATELET  URINE RAPID DRUG SCREEN, HOSP PERFORMED  BASIC METABOLIC PANEL  CBC  TSH  I-STAT TROPOININ, ED  I-STAT TROPOININ, ED  Randolm Idol, ED    Imaging Review Dg Chest 1 View  12/31/2015  CLINICAL DATA:  Fall today, central chest pain, pain under RIGHT breast EXAM: CHEST 1 VIEW COMPARISON:  07/30/2010 FINDINGS: Enlargement of cardiac silhouette. Mediastinal contours and pulmonary vascularity normal. Mild atherosclerotic calcification aorta. Bronchitic changes with bibasilar atelectasis. Remaining lungs clear. No pleural effusion or pneumothorax. Bones demineralized with BILATERAL glenohumeral degenerative changes. IMPRESSION: Enlargement of cardiac silhouette. Bronchitic changes with bibasilar atelectasis. Electronically Signed   By: Lavonia Dana M.D.   On: 12/31/2015 16:12   Dg Forearm Left  12/31/2015  CLINICAL DATA:  [place] today, lateral wrist and forearm pain EXAM: LEFT FOREARM - 2 VIEW COMPARISON:  None FINDINGS: Radiocarpal joint space narrowing. Diffuse osseous demineralization. Elbow joint spaces preserved. No acute fracture, dislocation or bone destruction. Additional degenerative changes at  first [facility] joint and at distal pole of scaphoid. Minimal chondrocalcinosis. IMPRESSION: Osseous demineralization with degenerative changes and question CPPD at the carpus. No acute abnormalities. Electronically Signed   By: Lavonia Dana M.D.   On: 12/31/2015 16:16   Dg Wrist Complete Left  12/31/2015  CLINICAL DATA:  Pain following fall EXAM: LEFT WRIST - COMPLETE 3+ VIEW COMPARISON:  None. FINDINGS: Frontal, oblique, lateral, and ulnar deviation scaphoid images were obtained. There is no demonstrable fracture or dislocation.  Bones are diffusely osteoporotic. There is moderate narrowing of the radiocarpal joint with calcification in the radiocarpal joint region. There is moderately severe osteoarthritic change in the scaphotrapezial and first carpal -metacarpal joints. No erosive change. IMPRESSION: Bones osteoporotic. Multifocal osteoarthritic change. No acute fracture or dislocation. Electronically Signed   By: Lowella Grip III M.D.   On: 12/31/2015 16:21   Ct Head Wo Contrast  12/31/2015  CLINICAL DATA:  Episode of syncope with fall, hit head right frontal region, neck injury. EXAM: CT HEAD WITHOUT CONTRAST CT CERVICAL SPINE WITHOUT CONTRAST TECHNIQUE: Multidetector CT imaging of the head and cervical spine was performed following the standard protocol without intravenous contrast. Multiplanar CT image reconstructions of the cervical spine were also generated. COMPARISON:  Head CT dated 01/15/2008. FINDINGS: CT HEAD FINDINGS Again noted is a mild generalized brain atrophy with commensurate dilatation of the ventricles and sulci. Ventricles are stable in size and configuration. Mild chronic small vessel ischemic change again noted within the bilateral periventricular white matter regions. Incidental note made of a prominent benign perivascular space within the lower right basal ganglia region. There is no mass, hemorrhage, edema or other evidence of acute parenchymal abnormality. No extra-axial  hemorrhage. No osseous fracture or dislocation seen. Soft tissue edema noted over the right lower frontal bone. No underlying fracture. Both orbital globes appear normal in position and configuration. No retro-orbital hemorrhage or edema. CT CERVICAL SPINE FINDINGS Degenerative change noted throughout the cervical spine, mild to moderate in degree, with associated disc space narrowings and osseous spurring. Straightening of the normal cervical lordosis is likely related to these underlying degenerative changes. Minimal retrolisthesis of C5 and C6 also likely related to these underlying degenerative changes. There is no fracture line or displaced fracture fragment identified. Facet joints are normally aligned throughout. Atherosclerotic calcifications noted at the bilateral carotid bulb regions, left greater than right. Paravertebral soft tissues otherwise unremarkable. IMPRESSION: 1. Soft tissue swelling/edema overlying the lower right frontal bone. No underlying fracture. 2. No acute intracranial abnormality. No intracranial mass, hemorrhage or edema. 3. No fracture or acute subluxation within the cervical spine. Degenerative changes throughout the cervical spine, as described above. Electronically Signed   By: Franki Cabot M.D.   On: 12/31/2015 17:58   Ct Angio Chest Pe W/cm &/or Wo Cm  12/31/2015  CLINICAL DATA:  Recent syncopal episode EXAM: CT ANGIOGRAPHY CHEST WITH CONTRAST TECHNIQUE: Multidetector CT imaging of the chest was performed using the standard protocol during bolus administration of intravenous contrast. Multiplanar CT image reconstructions and MIPs were obtained to evaluate the vascular anatomy. CONTRAST:  182mL OMNIPAQUE IOHEXOL 350 MG/ML SOLN COMPARISON:  None. FINDINGS: The lungs are well aerated bilaterally. Some dependent atelectatic changes are noted. The thoracic inlet is within normal limits. The thoracic aorta is calcified but it does not appear to be aneurysmally dilated. No  significant contrast enhancement to allow for dissection evaluation is noted. The pulmonary artery demonstrates a normal branching pattern. No evidence of pulmonary embolism is noted. No significant hilar or mediastinal adenopathy is noted. The visualized upper abdomen shows no acute abnormality. The osseous structures are within normal limits. Review of the MIP images confirms the above findings. IMPRESSION: No evidence of pulmonary emboli. Mild dependent atelectatic changes. Electronically Signed   By: Inez Catalina M.D.   On: 12/31/2015 17:55   Ct Cervical Spine Wo Contrast  12/31/2015  CLINICAL DATA:  Episode of syncope with fall, hit head right frontal region, neck injury. EXAM: CT HEAD WITHOUT CONTRAST CT  CERVICAL SPINE WITHOUT CONTRAST TECHNIQUE: Multidetector CT imaging of the head and cervical spine was performed following the standard protocol without intravenous contrast. Multiplanar CT image reconstructions of the cervical spine were also generated. COMPARISON:  Head CT dated 01/15/2008. FINDINGS: CT HEAD FINDINGS Again noted is a mild generalized brain atrophy with commensurate dilatation of the ventricles and sulci. Ventricles are stable in size and configuration. Mild chronic small vessel ischemic change again noted within the bilateral periventricular white matter regions. Incidental note made of a prominent benign perivascular space within the lower right basal ganglia region. There is no mass, hemorrhage, edema or other evidence of acute parenchymal abnormality. No extra-axial hemorrhage. No osseous fracture or dislocation seen. Soft tissue edema noted over the right lower frontal bone. No underlying fracture. Both orbital globes appear normal in position and configuration. No retro-orbital hemorrhage or edema. CT CERVICAL SPINE FINDINGS Degenerative change noted throughout the cervical spine, mild to moderate in degree, with associated disc space narrowings and osseous spurring. Straightening of  the normal cervical lordosis is likely related to these underlying degenerative changes. Minimal retrolisthesis of C5 and C6 also likely related to these underlying degenerative changes. There is no fracture line or displaced fracture fragment identified. Facet joints are normally aligned throughout. Atherosclerotic calcifications noted at the bilateral carotid bulb regions, left greater than right. Paravertebral soft tissues otherwise unremarkable. IMPRESSION: 1. Soft tissue swelling/edema overlying the lower right frontal bone. No underlying fracture. 2. No acute intracranial abnormality. No intracranial mass, hemorrhage or edema. 3. No fracture or acute subluxation within the cervical spine. Degenerative changes throughout the cervical spine, as described above. Electronically Signed   By: Franki Cabot M.D.   On: 12/31/2015 17:58   I have personally reviewed and evaluated these images and lab results as part of my medical decision-making.   EKG Interpretation   Date/Time:  Wednesday December 31 2015 15:10:33 EDT Ventricular Rate:  61 PR Interval:  161 QRS Duration: 127 QT Interval:  445 QTC Calculation: 448 R Axis:   -55 Text Interpretation:  Sinus rhythm Left bundle branch block Baseline  wander in lead(s) II No significant change since last tracing Confirmed by  [facility] MD, Hernandez Losasso ([id number]) on 12/31/2015 3:55:56 PM      MDM   Final diagnoses:  Syncope, unspecified syncope type  Simple laceration of face, initial encounter  Fall, initial encounter   82 year old female with a history of CHF, hypertension presents with concern for syncope.  Patient does not recall what happened, does not remember a prodrome, however she woke up in the driveway*in by police. History was that patient had altered mental status, however on my exam she has returned to baseline.  EKG was evaluated by me and shows a left bundle branch block, similar to prior.  Given patient with head trauma, midline neck pain,  ordered CT head and cervical spine which showed no acute injury.  Head laceration closed with dermabond.  Patient reports chest pain, and ordered CT PE study to evaluate for PE, dissection, or rib fractures which showed no sign of injury or PE.  Patient without any abdominal tenderness, and have low suspicion for intra-abdominal cause of syncope or abdominal traumatic injury.  She denies black/bloody stools, new medications, possibility of withdrawal.    Pt with possible postictal period following this episode, however no other history of seizure-like activity, no loss of control of bowel/bladder, no tongue biting. History of syncope with no prodrome and history congestive heart failure is concerning for possible cardiac  arrhythmia  Patient to be admitted for syncope observation.   Gareth Morgan, MD 01/01/16 [phone]

## 2015-12-31 NOTE — ED Notes (Signed)
Placed pt on bed pan.

## 2015-12-31 NOTE — ED Notes (Signed)
On my arrival in the room the pt was insisting on going home.  The niece has called now and talked to her.  Now she has decided to stay  C/o being hungry  Alert oriented skin warm and dry no distress

## 2015-12-31 NOTE — ED Notes (Signed)
Admitting  Doctor at the bedside. Food given pt has been requesting

## 2016-01-01 ENCOUNTER — Observation Stay (HOSPITAL_COMMUNITY): Payer: Medicare Other

## 2016-01-01 ENCOUNTER — Encounter (HOSPITAL_COMMUNITY): Payer: Self-pay | Admitting: General Practice

## 2016-01-01 DIAGNOSIS — I10 Essential (primary) hypertension: Secondary | ICD-10-CM

## 2016-01-01 DIAGNOSIS — I509 Heart failure, unspecified: Secondary | ICD-10-CM | POA: Diagnosis not present

## 2016-01-01 DIAGNOSIS — W19XXXA Unspecified fall, initial encounter: Secondary | ICD-10-CM | POA: Diagnosis not present

## 2016-01-01 LAB — BASIC METABOLIC PANEL
Anion gap: 9 (ref 5–15)
BUN: 9 mg/dL (ref 6–20)
CALCIUM: 9.2 mg/dL (ref 8.9–10.3)
CO2: 32 mmol/L (ref 22–32)
CREATININE: 0.72 mg/dL (ref 0.44–1.00)
Chloride: 104 mmol/L (ref 101–111)
GFR calc Af Amer: >60 mL/min (ref >60)
GLUCOSE: 110 mg/dL — AB (ref 65–99)
Potassium: 3.9 mmol/L (ref 3.5–5.1)
Sodium: 145 mmol/L (ref 135–145)

## 2016-01-01 LAB — CBC
HCT: 45 % (ref 36.0–46.0)
Hemoglobin: 15 g/dL (ref 12.0–15.0)
MCH: 30.3 pg (ref 26.0–34.0)
MCHC: 33.3 g/dL (ref 30.0–36.0)
MCV: 90.9 fL (ref 78.0–100.0)
PLATELETS: 170 10*3/uL (ref 150–400)
RBC: 4.95 MIL/uL (ref 3.87–5.11)
RDW: 13.2 % (ref 11.5–15.5)
WBC: 8.3 10*3/uL (ref 4.0–10.5)

## 2016-01-01 LAB — RAPID URINE DRUG SCREEN, HOSP PERFORMED
Amphetamines: NOT DETECTED
Barbiturates: NOT DETECTED
Benzodiazepines: NOT DETECTED
Cocaine: NOT DETECTED
Opiates: NOT DETECTED
Tetrahydrocannabinol: NOT DETECTED

## 2016-01-01 LAB — VITAMIN B12: Vitamin B-12: 447 pg/mL (ref 180–914)

## 2016-01-01 LAB — TSH: TSH: 0.072 u[IU]/mL — AB (ref 0.350–4.500)

## 2016-01-01 MED ORDER — ISOSORBIDE MONONITRATE ER 30 MG PO TB24
15.0000 mg | ORAL_TABLET | Freq: Every day | ORAL | Status: DC
  Administered 2016-01-02: 15 mg via ORAL
  Filled 2016-01-01: qty 1

## 2016-01-01 MED ORDER — DENOSUMAB 60 MG/ML ~~LOC~~ SOLN: 60.0000 mg | SUBCUTANEOUS | Status: DC

## 2016-01-01 MED ORDER — ISOSORBIDE MONONITRATE ER 30 MG PO TB24: 30.0000 mg | ORAL_TABLET | Freq: Every day | ORAL | Status: DC

## 2016-01-01 MED ORDER — LEVOTHYROXINE SODIUM 50 MCG PO TABS
50.0000 ug | ORAL_TABLET | Freq: Every day | ORAL | Status: DC
  Administered 2016-01-02: 50 ug via ORAL
  Filled 2016-01-01: qty 1

## 2016-01-01 MED ORDER — IPRATROPIUM BROMIDE 0.06 % NA SOLN
2.0000 | Freq: Every day | NASAL | Status: DC
  Administered 2016-01-01 – 2016-01-02 (×2): 2 via NASAL
  Filled 2016-01-01: qty 15

## 2016-01-01 NOTE — Care Management Obs Status (Signed)
MEDICARE OBSERVATION STATUS NOTIFICATION   Patient Details  Name: Elzie W Sporrer MRN: [id number] Date of Birth: 08-14-1931   Medicare Observation Status Notification Given:  Yes    Nila Nephew, RN 01/01/2016, 9:44 AM

## 2016-01-01 NOTE — Care Management Note (Signed)
Case Management Note  Patient Details  Name: Tamanika W Triggs MRN: [id number] Date of Birth: [birth year]  Subjective/Objective:                 Admitted after fall with memory loss   Action/Plan: Spoke with patient about discharge plan, she stated that she has lived alone for 47yrs without any issues. She has hx of knee replace with a stay at inpt rehab, HHPT and then outpatient PT and stated that she does not feel that she needs any additional therapy but is wiling to work with PT at the hospital. She currently has family staying with her. Has a rolling walker and 3N1 at home. CM will continue to follow for d/c needs.       Expected Discharge Date:                  Expected Discharge Plan:  Home/Self Care  In-House Referral:  NA  Discharge planning Services  CM Consult  Post Acute Care Choice:    Choice offered to:     DME Arranged:    DME Agency:     HH Arranged:    HH Agency:     Status of Service:  In process, will continue to follow  Medicare Important Message Given:    Date Medicare IM Given:    Medicare IM give by:    Date Additional Medicare IM Given:    Additional Medicare Important Message give by:     If discussed at [place] of Stay Meetings, dates discussed:    Additional Comments:  Nila Nephew, RN 01/01/2016, 9:45 AM

## 2016-01-01 NOTE — Progress Notes (Signed)
Maria [name] [id number] DOB: [birth year] DOA: 12/31/2015 PCP: Isaias Cowman, PA-C  Brief narrative: 82 y/o ? Recent repair left knee 2/ 2017 Hip arthritis status post THR 12/2010 ? Viral cardiomyopathy-cardiac cath performed 9/10/7 showed no disease Left bundle-branch block Hypothyroid Heart failure with last EF 07/1999 1155% ? Depression  Admitted with fall, syncope and memory loss CT neck head and spinenegative for acute pathology other than bruises to the face  orthostatics grossly positive  Past medical history-As per Problem list Chart reviewed as below- reviewed  Consultants:   none  Procedures:  none  Antibiotics:  Ceftriaxone   Subjective   Pleasant no discomfort in face Recounts h/o occasional tangentiality and episodic retrograde amnesia. No cp no n No vomit No blurred nor double vision   Objective    Interim History:   Telemetry: Sinus, occ PVC   Objective: Filed Vitals:   12/31/15 2202 01/01/16 0515 01/01/16 0518 01/01/16 0519  BP: 171/64 142/62 135/66 127/60  Pulse: 84 83 64 65  Temp: 99.3 F (37.4 C) 98.1 F (36.7 C)    TempSrc: Oral Oral    Resp: 18 18 19 18   Height:      SpO2: 97% 94% 98% 100%   No intake or output data in the 24 hours ending 01/01/16 0943  Exam:  General: eomi ecchymotic r side face.  Some reptition of prior phrases but for most part clear.  Tendency to ramble into non-current medical issues Cardiovascular: s1 s 2no m/r/g Respiratory: clear no added sound Abdomen:  Soft nt nd no rebound Skin see above-no swelling Neuro grossly intact.  Moving all 4 limbs x 4  Data Reviewed: Basic Metabolic Panel:  Recent Labs Lab 12/31/15 1525 01/01/16 0532  NA 141 145  K 4.2 3.9  CL 105 104  CO2 27 32  GLUCOSE 97 110*  BUN 14 9  CREATININE 0.72 0.72  CALCIUM 9.3 9.2   Liver Function Tests:  Recent Labs Lab 12/31/15 1525  AST 22  ALT 13*  ALKPHOS 78  BILITOT 0.9  PROT 6.0*  ALBUMIN 3.7   No  results for input(s): LIPASE, AMYLASE in the last 168 hours. No results for input(s): AMMONIA in the last 168 hours. CBC:  Recent Labs Lab 12/31/15 1525 01/01/16 0532  WBC 8.5 8.3  NEUTROABS 5.9  --   HGB 14.2 15.0  HCT 42.9 45.0  MCV 90.9 90.9  PLT 167 170   Cardiac Enzymes: No results for input(s): CKTOTAL, CKMB, CKMBINDEX, TROPONINI in the last 168 hours. BNP: Invalid input(s): POCBNP CBG: No results for input(s): GLUCAP in the last 168 hours.  No results found for this or any previous visit (from the past 240 hour(s)).   Studies:              All Imaging reviewed and is as per above notation   Scheduled Meds: . ALPRAZolam  0.25 mg Oral TID  . carvedilol  3.125 mg Oral BID WC  . cefTRIAXone (ROCEPHIN)  IV  1 g Intravenous Q24H  . enoxaparin (LOVENOX) injection  40 mg Subcutaneous Q24H  . ipratropium  2 spray Each Nare q morning - 10a  . isosorbide mononitrate  30 mg Oral Daily  . latanoprost  1 drop Both Eyes q morning - 10a  . levothyroxine  100 mcg Oral QAC breakfast  . lisinopril  5 mg Oral q morning - 10a  . pantoprazole  40 mg Oral Daily  . sodium chloride flush  3 mL Intravenous  Q12H   Continuous Infusions:    Assessment/Plan:  1. Fall-? cv vs central-get MRI brain.  Orthostatics grossly +-cut back imdur 30-15 [recent addition to meds].  D/c Lisinopril.  Get therapy eval re: recommendations for safety 2. H/o [place] in the past-see above changes-continue coreg 3.125 3. ?abdn nuc test recently-follow up with [facility] cardiology 4. Retrograde amnesia-forgetful-work-up b12/hiv/rpr pending.  MRI pending 5. Hypothyroid and currently hyperthyroid with TSH suppressed to 0.072 on 100 Ug of thyroxine-cut back dose to 50 ug 6. Bipolar predominant Depression/?dementia-Consider as OP GDS and MMSE-for now await work-up.  Cont Xanax [long term meds] 0.25, lexapro recently started.  Consider Prozac as OP given activating properties per PCP 7. GERD-protonix 40  daiyl-consider in its place TUMS as OP  >35 min with multiple other issues visitied D/w niece bedside   Verneita Griffes, MD  [facility] Pager [phone] 01/01/2016, 9:43 AM

## 2016-01-01 NOTE — Evaluation (Addendum)
Physical Therapy Evaluation Patient Details Name: Maria [name] MRN: [id number] DOB: 1930/10/20 Today's Date: 01/01/2016   History of Present Illness  82 year old female with a past medical history significant for anxiety, hypothyroidism, arthritis, s/p L. Knee replacement, and CHF; who presents after possible syncope with fall resulting in head lac.  Clinical Impression  Patient demonstrates deficits in functional mobility as indicated below. Will need continued skilled PT to address deficits and maximize function. Will see as indicated and progress as tolerated.  Patient with instability during activity, remains very high fall risk at this time. Spoke with patient regarding HHPT as patient will need to be independent at home (resides alone and niece who is visiting will only be here for a week). Patient initially reluctant but agreeable. Patient does seem to have periods of poor recognition of deficits and limited safety awareness. Will continue to see acutely.     Follow Up Recommendations Home health PT;Supervision for mobility/OOB    Equipment Recommendations  None recommended by PT (recommend patient use her RW until cleared by HHPT)    Recommendations for Other Services       Precautions / Restrictions Precautions Precautions: Fall Restrictions Weight Bearing Restrictions: No      Mobility  Bed Mobility Overal bed mobility: Modified Independent             General bed mobility comments: increased time secondary to pain in left wrist  Transfers Overall transfer level: Needs assistance Equipment used: 1 person hand held assist Transfers: Sit to/from Stand Sit to Stand: Min assist         General transfer comment: min assist for stability in elevating to standing, perofrmed from bed and toilet, cued for hand placement and technique to power up from lower surface  Ambulation/Gait Ambulation/Gait assistance: Min assist Ambulation Distance (Feet): 210 Feet Assistive  device: 1 person hand held assist Gait Pattern/deviations: Step-through pattern;Decreased stride length;Drifts right/left;Narrow base of support Gait velocity: decreased Gait velocity interpretation: Below normal speed for age/gender General Gait Details: increased instability noted with ambulation, multiple balance checks with noted LOB requiring assist to stabilize. Increased lateral sway at times. VCs for increased cadence to improve balance but patient reports difficulty seoondary to knee discomfort  Stairs Stairs: Yes Stairs assistance: Min assist Stair Management: One rail Left;Step to pattern;Forwards Number of Stairs: 5 General stair comments: Assist for UE support due to lack of 2nd rail, assist for stability  Wheelchair Mobility    Modified Rankin (Stroke Patients Only)       Balance Overall balance assessment: History of Falls                                           Pertinent Vitals/Pain Pain Assessment: Faces Faces Pain Scale: Hurts little more Pain Location: left wrist, knees, ribs Pain Descriptors / Indicators: Discomfort;Grimacing;Guarding Pain Intervention(s): Monitored during session;Repositioned;Limited activity within patient's tolerance    Home Living Family/patient expects to be discharged to:: Private residence Living Arrangements: Alone Available Help at Discharge: Family;Available 24 hours/day (initially for 1 wk) Type of Home: Mobile home Home Access: Stairs to enter Entrance Stairs-Rails: Can reach both Entrance Stairs-Number of Steps: 4 Home Layout: One level Home Equipment: Walker - 2 wheels;Cane - single point      Prior Function Level of Independence: Independent with assistive device(s)  Hand Dominance   Dominant Hand: Right    Extremity/Trunk Assessment   Upper Extremity Assessment: LUE deficits/detail           Lower Extremity Assessment: Overall WFL for tasks assessed          Communication   Communication: No difficulties  Cognition Arousal/Alertness: Awake/alert Behavior During Therapy: Impulsive Overall Cognitive Status: Impaired/Different from baseline Area of Impairment: Memory;Safety/judgement     Memory: Decreased short-term memory (to events of fall)   Safety/Judgement: Decreased awareness of safety;Decreased awareness of deficits (once cues for safety and deficits pt did acknowledge them)          General Comments      Exercises        Assessment/Plan    PT Assessment Patient needs continued PT services  PT Diagnosis Difficulty walking;Abnormality of gait;Acute pain   PT Problem List Decreased activity tolerance;Decreased balance;Decreased mobility;Decreased safety awareness;Pain  PT Treatment Interventions DME instruction;Gait training;Stair training;Functional mobility training;Therapeutic activities;Therapeutic exercise;Balance training   PT Goals (Current goals can be found in the Care Plan section) Acute Rehab PT Goals Patient Stated Goal: to go home PT Goal Formulation: With patient/family Time For Goal Achievement: 01/15/16 Potential to Achieve Goals: Good    Frequency Min 3X/week   Barriers to discharge Decreased caregiver support      Co-evaluation               End of Session Equipment Utilized During Treatment: Gait belt Activity Tolerance: Patient tolerated treatment well Patient left: in bed;with call bell/phone within reach;with family/visitor present Nurse Communication: Mobility status         Time: [id number] PT Time Calculation (min) (ACUTE ONLY): 24 min   Charges:   PT Evaluation $PT Eval Moderate Complexity: 1 Procedure PT Treatments $Gait Training: 8-22 mins   PT G Codes:   PT G-Codes **NOT FOR INPATIENT CLASS** Functional Assessment Tool Used: clinical judgement Functional Limitation: Mobility: Walking and moving around Mobility: Walking and Moving Around Current Status [id number]): At  least 40 percent but less than 60 percent impaired, limited or restricted Mobility: Walking and Moving Around Goal Status [phone]): At least 20 percent but less than 40 percent impaired, limited or restricted    Duncan Dull 01/01/2016, 1:28 PM  Alben Deeds, [place] DPT  [phone]

## 2016-01-02 DIAGNOSIS — W19XXXA Unspecified fall, initial encounter: Secondary | ICD-10-CM | POA: Diagnosis not present

## 2016-01-02 DIAGNOSIS — I509 Heart failure, unspecified: Secondary | ICD-10-CM | POA: Diagnosis not present

## 2016-01-02 LAB — HIV ANTIBODY (ROUTINE TESTING W REFLEX): HIV Screen 4th Generation wRfx: NONREACTIVE

## 2016-01-02 LAB — URINE CULTURE: Culture: >=100000

## 2016-01-02 LAB — RPR: RPR Ser Ql: NONREACTIVE

## 2016-01-02 MED ORDER — ISOSORBIDE MONONITRATE ER 30 MG PO TB24: 15.0000 mg | ORAL_TABLET | Freq: Every day | ORAL | Status: AC

## 2016-01-02 NOTE — Progress Notes (Signed)
Patient talked with case worker Ricki Miller and patient declined [facility] PT

## 2016-01-02 NOTE — Discharge Summary (Addendum)
Physician Discharge Summary  Maria [name] [id number] DOB: Mar 15, 1931 DOA: 12/31/2015  PCP: Isaias Cowman, PA-C  Admit date: 12/31/2015 Discharge date: 01/02/2016  Time spent: 40 minutes  Recommendations for Outpatient Follow-up:  1. consider event monitor as an outpatient 2. Patient declining to use home health services and states she will go back to pool therapy 3. Multiple medications adjusted this admission-see list 4. Recommend TSH check in one to 2 weeks 5. she had asymptomatic bacteriuria treated with 2 doses of Rocephin and this was discontinued 6.  problem list for further recommendations regarding Prozac, further workup if recurrent   Discharge Diagnoses:  Principal Problem:   Fall Active Problems:   Syncope   Simple laceration of face   Congestive heart failure ([place])   Essential hypertension   Hypothyroidism   UTI (urinary tract infection)   Left bundle branch block   Discharge Condition: improved  Diet recommendation: heart healthy  Filed Weights   01/01/16 1500  Weight: 63.504 kg (140 lb)    History of present illness:   82 y/o ? Recent repair left knee 2/ 2017 Hip arthritis status post THR 12/2010 ? Viral cardiomyopathy-cardiac cath performed 9/10/7 showed no disease Left bundle-branch block Hypothyroid Heart failure with last EF 07/1999 1155% ? Depression  Admitted with fall, syncope and memory loss CT neck head and spinenegative for acute pathology other than bruises to the face orthostatics grossly positive  Hospital Course:  Assessment/Plan:  1. Fall-? cv vs central-MRI brain showed chronic atrophy, cardiac monitoring showed occasional PVCs only. Orthostatics grossly +-cut back imdur 30-15 [recent addition to meds]. D/c Lisinopril. Get therapy eval re: recommendations for safety-they recommended home health but patient wished to go back to pool therapy---other causes for syncope with memory loss could be potential seizure which is  unlikely and potentially arrhythmia or evenmild aortic stenosis-if recurrent episodic issues, would consider as an outpatient neurology referral versus Holter monitoring however I feel this could be explained by orthostasis as she stated on day of discharge that when she had the fall she was going up and down the stairs to take lookout for a boy who was crossing the road unsafely 2. H/o [place] in the past-see above changes-continue coreg 3.125 3. ?abdn nuc test recently-follow up with [facility] cardiology 4. Retrograde amnesia-forgetful-work-up b12/hiv/rpr was negative. MRI negative as above 5. Hypothyroid and currently hyperthyroid with TSH suppressed to 0.072 on 100 Ug of thyroxine-cut back dose to 50 ug-needs outpatient TSH 6. Asymptomatic bacteria-treated with 2 doses of Rocephin-resolved 7. Bipolar predominant Depression/?dementia-Consider as OP GDS and MMSE-for now await work-up. Cont Xanax [long term meds] 0.25, lexapro recently started. Consider Prozac as OP given activating properties per PCP 8. GERD-protonix 40 daiyl-consider in its place TUMS as OP    Discharge Exam: Filed Vitals:   01/01/16 2038 01/02/16 0425  BP: 152/56 151/56  Pulse: 60 52  Temp: 98.2 F (36.8 C) 98.2 F (36.8 C)  Resp: 18 16    General: EOMI NCAT Cardiovascular: S1 and S2 no murmur rub or gallop  Respiratory: clear  Discharge Instructions   Discharge Instructions    Diet - low sodium heart healthy    Complete by:  As directed      Discharge instructions    Complete by:  As directed   multiple medications have been adjusted downwardsas it was felt your fall may been a result of blood pressure dropping with change in position Please look at your medication list carefully Additionally your TSH level which is  a measure of your thyroid hormone in your body was diminished and this may reflect that you were likely on a little bit of a higher dose of thyroxine than you needed therefore we cut back the  dose as per our instructions  you were treated transiently for what we felt was asymptomatic bacteriuria and it was not felt that you had a clinically significant urinary tract infectionbecause of the way that you urine was collected by a clean catch--nevertheless you received 2 doses of antibiotics and standard treatment doses this are 3 doses IV so you are covered even if it was 1  please follow-up with cardiology for consideration of an event monitor although based on your monitoring during the hospital stay we only saw a couple of extra beats here and there  Your MRI did not show any specific findings of any type of stroke or other concern and we do not have an explainable cause as to why you had loss of consciousness   Rest assured however that most severe and serious illnesses have been ruled out     Increase activity slowly    Complete by:  As directed           Current Discharge Medication List    CONTINUE these medications which have CHANGED   Details  isosorbide mononitrate (IMDUR) 30 MG 24 hr tablet Take 0.5 tablets (15 mg total) by mouth daily.      CONTINUE these medications which have NOT CHANGED   Details  acetaminophen (TYLENOL) 650 MG CR tablet Take 650 mg by mouth every 8 (eight) hours as needed for pain.     acyclovir ointment (ZOVIRAX) 5 % Apply 1 application topically daily as needed (for outbreaks).     ALPRAZolam (XANAX) 0.25 MG tablet Take 0.25 mg by mouth 3 (three) times daily.    carvedilol (COREG) 3.125 MG tablet Take 3.125 mg by mouth 2 (two) times daily with a meal.    escitalopram (LEXAPRO) 10 MG tablet Take 10 mg by mouth daily. Refills: 0    latanoprost (XALATAN) 0.005 % ophthalmic solution Place 1 drop into both eyes every morning.    levothyroxine (SYNTHROID, LEVOTHROID) 100 MCG tablet Take 100 mcg by mouth daily. Refills: 0    MELATONIN PO Take 1 tablet by mouth every evening.    omega-3 acid ethyl esters (LOVAZA) 1 g capsule Take 1 g by  mouth 2 (two) times daily.    Omega-3 Fatty Acids (OMEGA-3 FISH OIL PO) Take 650 mg by mouth 2 (two) times daily.    omeprazole (PRILOSEC) 40 MG capsule Take 40 mg by mouth every evening. Refills: 0    PROLIA 60 MG/ML SOLN injection Inject 60 mg into the skin every 6 (six) months.  Refills: 0    thyroid (ARMOUR) 30 MG tablet Take 30 mg by mouth every morning.    traMADol (ULTRAM) 50 MG tablet Take 1-2 tablets (50-100 mg total) by mouth every 6 (six) hours as needed (mild pain). Qty: 60 tablet, Refills: 1      STOP taking these medications     fluticasone (FLONASE) 50 MCG/ACT nasal spray      lisinopril (PRINIVIL,ZESTRIL) 5 MG tablet        Allergies  Allergen Reactions  . Aspirin Other (See Comments)    Gastritis and stomach ulcers      The results of significant diagnostics from this hospitalization (including imaging, microbiology, ancillary and laboratory) are listed below for reference.    Significant Diagnostic  Studies: Dg Chest 1 View  12/31/2015  CLINICAL DATA:  Fall today, central chest pain, pain under RIGHT breast EXAM: CHEST 1 VIEW COMPARISON:  07/30/2010 FINDINGS: Enlargement of cardiac silhouette. Mediastinal contours and pulmonary vascularity normal. Mild atherosclerotic calcification aorta. Bronchitic changes with bibasilar atelectasis. Remaining lungs clear. No pleural effusion or pneumothorax. Bones demineralized with BILATERAL glenohumeral degenerative changes. IMPRESSION: Enlargement of cardiac silhouette. Bronchitic changes with bibasilar atelectasis. Electronically Signed   By: Lavonia Dana M.D.   On: 12/31/2015 16:12   Dg Forearm Left  12/31/2015  CLINICAL DATA:  [place] today, lateral wrist and forearm pain EXAM: LEFT FOREARM - 2 VIEW COMPARISON:  None FINDINGS: Radiocarpal joint space narrowing. Diffuse osseous demineralization. Elbow joint spaces preserved. No acute fracture, dislocation or bone destruction. Additional degenerative changes at first [facility]  joint and at distal pole of scaphoid. Minimal chondrocalcinosis. IMPRESSION: Osseous demineralization with degenerative changes and question CPPD at the carpus. No acute abnormalities. Electronically Signed   By: Lavonia Dana M.D.   On: 12/31/2015 16:16   Dg Wrist Complete Left  12/31/2015  CLINICAL DATA:  Pain following fall EXAM: LEFT WRIST - COMPLETE 3+ VIEW COMPARISON:  None. FINDINGS: Frontal, oblique, lateral, and ulnar deviation scaphoid images were obtained. There is no demonstrable fracture or dislocation. Bones are diffusely osteoporotic. There is moderate narrowing of the radiocarpal joint with calcification in the radiocarpal joint region. There is moderately severe osteoarthritic change in the scaphotrapezial and first carpal -metacarpal joints. No erosive change. IMPRESSION: Bones osteoporotic. Multifocal osteoarthritic change. No acute fracture or dislocation. Electronically Signed   By: Lowella Grip III M.D.   On: 12/31/2015 16:21   Ct Head Wo Contrast  12/31/2015  CLINICAL DATA:  Episode of syncope with fall, hit head right frontal region, neck injury. EXAM: CT HEAD WITHOUT CONTRAST CT CERVICAL SPINE WITHOUT CONTRAST TECHNIQUE: Multidetector CT imaging of the head and cervical spine was performed following the standard protocol without intravenous contrast. Multiplanar CT image reconstructions of the cervical spine were also generated. COMPARISON:  Head CT dated 01/15/2008. FINDINGS: CT HEAD FINDINGS Again noted is a mild generalized brain atrophy with commensurate dilatation of the ventricles and sulci. Ventricles are stable in size and configuration. Mild chronic small vessel ischemic change again noted within the bilateral periventricular white matter regions. Incidental note made of a prominent benign perivascular space within the lower right basal ganglia region. There is no mass, hemorrhage, edema or other evidence of acute parenchymal abnormality. No extra-axial hemorrhage. No  osseous fracture or dislocation seen. Soft tissue edema noted over the right lower frontal bone. No underlying fracture. Both orbital globes appear normal in position and configuration. No retro-orbital hemorrhage or edema. CT CERVICAL SPINE FINDINGS Degenerative change noted throughout the cervical spine, mild to moderate in degree, with associated disc space narrowings and osseous spurring. Straightening of the normal cervical lordosis is likely related to these underlying degenerative changes. Minimal retrolisthesis of C5 and C6 also likely related to these underlying degenerative changes. There is no fracture line or displaced fracture fragment identified. Facet joints are normally aligned throughout. Atherosclerotic calcifications noted at the bilateral carotid bulb regions, left greater than right. Paravertebral soft tissues otherwise unremarkable. IMPRESSION: 1. Soft tissue swelling/edema overlying the lower right frontal bone. No underlying fracture. 2. No acute intracranial abnormality. No intracranial mass, hemorrhage or edema. 3. No fracture or acute subluxation within the cervical spine. Degenerative changes throughout the cervical spine, as described above. Electronically Signed   By: Roxy Horseman.D.  On: 12/31/2015 17:58   Ct Angio Chest Pe W/cm &/or Wo Cm  12/31/2015  CLINICAL DATA:  Recent syncopal episode EXAM: CT ANGIOGRAPHY CHEST WITH CONTRAST TECHNIQUE: Multidetector CT imaging of the chest was performed using the standard protocol during bolus administration of intravenous contrast. Multiplanar CT image reconstructions and MIPs were obtained to evaluate the vascular anatomy. CONTRAST:  14mL OMNIPAQUE IOHEXOL 350 MG/ML SOLN COMPARISON:  None. FINDINGS: The lungs are well aerated bilaterally. Some dependent atelectatic changes are noted. The thoracic inlet is within normal limits. The thoracic aorta is calcified but it does not appear to be aneurysmally dilated. No significant contrast  enhancement to allow for dissection evaluation is noted. The pulmonary artery demonstrates a normal branching pattern. No evidence of pulmonary embolism is noted. No significant hilar or mediastinal adenopathy is noted. The visualized upper abdomen shows no acute abnormality. The osseous structures are within normal limits. Review of the MIP images confirms the above findings. IMPRESSION: No evidence of pulmonary emboli. Mild dependent atelectatic changes. Electronically Signed   By: Inez Catalina M.D.   On: 12/31/2015 17:55   Ct Cervical Spine Wo Contrast  12/31/2015  CLINICAL DATA:  Episode of syncope with fall, hit head right frontal region, neck injury. EXAM: CT HEAD WITHOUT CONTRAST CT CERVICAL SPINE WITHOUT CONTRAST TECHNIQUE: Multidetector CT imaging of the head and cervical spine was performed following the standard protocol without intravenous contrast. Multiplanar CT image reconstructions of the cervical spine were also generated. COMPARISON:  Head CT dated 01/15/2008. FINDINGS: CT HEAD FINDINGS Again noted is a mild generalized brain atrophy with commensurate dilatation of the ventricles and sulci. Ventricles are stable in size and configuration. Mild chronic small vessel ischemic change again noted within the bilateral periventricular white matter regions. Incidental note made of a prominent benign perivascular space within the lower right basal ganglia region. There is no mass, hemorrhage, edema or other evidence of acute parenchymal abnormality. No extra-axial hemorrhage. No osseous fracture or dislocation seen. Soft tissue edema noted over the right lower frontal bone. No underlying fracture. Both orbital globes appear normal in position and configuration. No retro-orbital hemorrhage or edema. CT CERVICAL SPINE FINDINGS Degenerative change noted throughout the cervical spine, mild to moderate in degree, with associated disc space narrowings and osseous spurring. Straightening of the normal cervical  lordosis is likely related to these underlying degenerative changes. Minimal retrolisthesis of C5 and C6 also likely related to these underlying degenerative changes. There is no fracture line or displaced fracture fragment identified. Facet joints are normally aligned throughout. Atherosclerotic calcifications noted at the bilateral carotid bulb regions, left greater than right. Paravertebral soft tissues otherwise unremarkable. IMPRESSION: 1. Soft tissue swelling/edema overlying the lower right frontal bone. No underlying fracture. 2. No acute intracranial abnormality. No intracranial mass, hemorrhage or edema. 3. No fracture or acute subluxation within the cervical spine. Degenerative changes throughout the cervical spine, as described above. Electronically Signed   By: Franki Cabot M.D.   On: 12/31/2015 17:58   Mr Brain Wo Contrast  01/01/2016  CLINICAL DATA:  Initial evaluation for dementia. EXAM: MRI HEAD WITHOUT CONTRAST TECHNIQUE: Multiplanar, multiecho pulse sequences of the brain and surrounding structures were obtained without intravenous contrast. COMPARISON:  Prior CT from 12/31/2015. FINDINGS: Diffuse prominence of the CSF containing spaces is compatible with generalized age-related cerebral atrophy, mild for age. Patchy T2/FLAIR hyperintensity within the periventricular and deep white matter both cerebral hemispheres most consistent with chronic small vessel ischemic disease, also fairly mild for patient age. No  abnormal foci of restricted diffusion to suggest acute intracranial infarct. Gray-white matter differentiation maintained. Major intracranial vascular flow voids are preserved. No acute or chronic intracranial hemorrhage. No areas of chronic infarction. No mass lesion, midline shift, or mass effect. Mild ventricular prominence related to global parenchymal volume loss without hydrocephalus. No extra-axial fluid collection. Choroidal fissure cyst noted on the right. Craniocervical junction  within normal limits. Mild degenerative spondylolysis within the visualized upper cervical spine without significant stenosis. Pituitary gland within normal limits. Midline structures intact. No acute abnormality about the orbits. Sequela prior bilateral lens extraction noted. Paranasal sinuses are clear. No mastoid effusion. Inner ear structures grossly normal. Bone marrow signal intensity within normal limits. Minimal scalp edema overlying the right frontal/zygomatic region. Scalp soft tissues otherwise unremarkable. IMPRESSION: 1. No acute intracranial process. 2. Generalized cerebral atrophy with chronic small vessel ischemic disease, mild for age. Electronically Signed   By: Jeannine Boga M.D.   On: 01/01/2016 22:17    Microbiology: Recent Results (from the past 240 hour(s))  Urine culture     Status: None   Collection Time: 12/31/15  3:15 PM  Result Value Ref Range Status   Specimen Description URINE, CLEAN CATCH  Final   Special Requests NONE  Final   Culture >=100,000 COLONIES/mL ESCHERICHIA COLI  Final   Report Status 01/02/2016 FINAL  Final   Organism ID, Bacteria ESCHERICHIA COLI  Final      Susceptibility   Escherichia coli - MIC*    AMPICILLIN 8 SENSITIVE Sensitive     CEFAZOLIN <=4 SENSITIVE Sensitive     CEFTRIAXONE <=1 SENSITIVE Sensitive     CIPROFLOXACIN <=0.25 SENSITIVE Sensitive     GENTAMICIN <=1 SENSITIVE Sensitive     IMIPENEM <=0.25 SENSITIVE Sensitive     NITROFURANTOIN <=16 SENSITIVE Sensitive     TRIMETH/SULFA <=20 SENSITIVE Sensitive     AMPICILLIN/SULBACTAM 4 SENSITIVE Sensitive     PIP/TAZO <=4 SENSITIVE Sensitive     * >=100,000 COLONIES/mL ESCHERICHIA COLI     Labs: Basic Metabolic Panel:  Recent Labs Lab 12/31/15 1525 01/01/16 0532  NA 141 145  K 4.2 3.9  CL 105 104  CO2 27 32  GLUCOSE 97 110*  BUN 14 9  CREATININE 0.72 0.72  CALCIUM 9.3 9.2   Liver Function Tests:  Recent Labs Lab 12/31/15 1525  AST 22  ALT 13*  ALKPHOS 78   BILITOT 0.9  PROT 6.0*  ALBUMIN 3.7   No results for input(s): LIPASE, AMYLASE in the last 168 hours. No results for input(s): AMMONIA in the last 168 hours. CBC:  Recent Labs Lab 12/31/15 1525 01/01/16 0532  WBC 8.5 8.3  NEUTROABS 5.9  --   HGB 14.2 15.0  HCT 42.9 45.0  MCV 90.9 90.9  PLT 167 170   Cardiac Enzymes: No results for input(s): CKTOTAL, CKMB, CKMBINDEX, TROPONINI in the last 168 hours. BNP: BNP (last 3 results) No results for input(s): BNP in the last 8760 hours.  ProBNP (last 3 results) No results for input(s): PROBNP in the last 8760 hours.  CBG: No results for input(s): GLUCAP in the last 168 hours.     SignedNita Sells MD   [facility] 01/02/2016, 12:48 PM

## 2016-01-21 ENCOUNTER — Telehealth: Payer: Self-pay | Admitting: Cardiovascular Disease

## 2016-01-21 NOTE — Telephone Encounter (Signed)
Received records from [facility] for appointment on 02/13/16 with Dr Gwenlyn Found.  Records given to [facility] (medical records) for Dr Kennon Holter schedule on 02/13/16. lp

## 2016-02-13 ENCOUNTER — Ambulatory Visit (INDEPENDENT_AMBULATORY_CARE_PROVIDER_SITE_OTHER): Payer: Medicare Other | Admitting: Cardiovascular Disease

## 2016-02-13 ENCOUNTER — Encounter: Payer: Self-pay | Admitting: Cardiovascular Disease

## 2016-02-13 VITALS — BP 124/74 | HR 53 | Ht 65.0 in | Wt 139.0 lb

## 2016-02-13 DIAGNOSIS — I447 Left bundle-branch block, unspecified: Secondary | ICD-10-CM | POA: Diagnosis not present

## 2016-02-13 DIAGNOSIS — I10 Essential (primary) hypertension: Secondary | ICD-10-CM

## 2016-02-13 DIAGNOSIS — R55 Syncope and collapse: Secondary | ICD-10-CM | POA: Diagnosis not present

## 2016-02-13 NOTE — Progress Notes (Signed)
02/13/2016 Maria [name]   May 01, 1931  [id number]  Primary Physician Maria Cowman, PA-C Primary Cardiologist: Maria Harp MD Maria [name]   HPI:  Maria [name] if an 82 year old thin-appearing widowed Caucasian female mother of 2 children who  is accompanied by one of her friends today. She was referred by Maria [name] for evaluation of a positive nuclear stress test and syncope. She has a history of chronic left bundle-branch block. She underwent cardiac catheterization approximately 10 years ago and was found to have nonobstructive disease. She gets occasional chest pain and shortness of breath. Comes include treated hypertension and hyperlipidemia She has had 2 episodes status post syncope, one specimen and one recently in March of unclear etiology. She was found to be orthostatic and her lisinopril was discontinued. She is on a low-dose beta blocker. She had a recentevent monitor that showed abnormalities which led to initiating oral anticoagulation (icing this was A. Fib). She had a 2-D echo performed in the recent past which was normal and a Myoview stress test that showed a small area of apical ischemia.   Current Outpatient Prescriptions  Medication Sig Dispense Refill  . acetaminophen (TYLENOL) 650 MG CR tablet Take 650 mg by mouth every 8 (eight) hours as needed for pain.     Marland Kitchen acyclovir ointment (ZOVIRAX) 5 % Apply 1 application topically daily as needed (for outbreaks).     . ALPRAZolam (XANAX) 0.25 MG tablet Take 0.25 mg by mouth 3 (three) times daily.    . carvedilol (COREG) 3.125 MG tablet Take 3.125 mg by mouth 2 (two) times daily with a meal.    . escitalopram (LEXAPRO) 10 MG tablet Take 10 mg by mouth daily.  0  . isosorbide mononitrate (IMDUR) 30 MG 24 hr tablet Take 0.5 tablets (15 mg total) by mouth daily.    Marland Kitchen latanoprost (XALATAN) 0.005 % ophthalmic solution Place 1 drop into both eyes every morning.    Marland Kitchen levothyroxine (SYNTHROID, LEVOTHROID) 100 MCG tablet  Take 100 mcg by mouth daily.  0  . MELATONIN PO Take 1 tablet by mouth every evening.    Marland Kitchen omega-3 acid ethyl esters (LOVAZA) 1 g capsule Take 1 g by mouth 2 (two) times daily.    . Omega-3 Fatty Acids (OMEGA-3 FISH OIL PO) Take 650 mg by mouth 2 (two) times daily.    Marland Kitchen omeprazole (PRILOSEC) 40 MG capsule Take 40 mg by mouth every evening.  0  . PROLIA 60 MG/ML SOLN injection Inject 60 mg into the skin every 6 (six) months.   0  . thyroid (ARMOUR) 30 MG tablet Take 30 mg by mouth every morning.    . traMADol (ULTRAM) 50 MG tablet Take 1-2 tablets (50-100 mg total) by mouth every 6 (six) hours as needed (mild pain). 60 tablet 1   No current facility-administered medications for this visit.    Allergies  Allergen Reactions  . Aspirin Other (See Comments)    Gastritis and stomach ulcers Gastritis and stomach ulcers    Social History   Social History  . Marital Status: Divorced    Spouse Name: N/A  . Number of Children: N/A  . Years of Education: N/A   Occupational History  . Not on file.   Social History Main Topics  . Smoking status: Former Research scientist (life sciences)  . Smokeless tobacco: Never Used     Comment: "smoked briefly as a teen"  . Alcohol Use: 4.2 oz/week    7 Glasses of wine  per week  . Drug Use: No  . Sexual Activity: No   Other Topics Concern  . Not on file   Social History Narrative     Review of Systems: General: negative for chills, fever, night sweats or weight changes.  Cardiovascular: negative for chest pain, dyspnea on exertion, edema, orthopnea, palpitations, paroxysmal nocturnal dyspnea or shortness of breath Dermatological: negative for rash Respiratory: negative for cough or wheezing Urologic: negative for hematuria Abdominal: negative for nausea, vomiting, diarrhea, bright red blood per rectum, melena, or hematemesis Neurologic: negative for visual changes, syncope, or dizziness All other systems reviewed and are otherwise negative except as noted  above.    Blood pressure 124/74, pulse 53, height 5\' 5"  (1.651 m), weight 139 lb (63.05 kg).  General appearance: alert and no distress Neck: no adenopathy, no carotid bruit, no JVD, supple, symmetrical, trachea midline and thyroid not enlarged, symmetric, no tenderness/mass/nodules Lungs: clear to auscultation bilaterally Heart: regular rate and rhythm, S1, S2 normal, no murmur, click, rub or gallop Extremities: extremities normal, atraumatic, no cyanosis or edema  EKG Sinus bradycardia 53 with lipoma branch block. I personally reviewed this EKG  ASSESSMENT AND PLAN:   Left bundle branch block chronic  Essential hypertension History of hypertension with blood pressure measured today at 124/74. She is on carvedilol Her lisinopril was stopped during her recent hospitalization because of orthostatic hypotension. Her heart rate today is 53. I'm not sure carvedilol is the best drug for her given her recent syncope and recurrent bradycardia. I will leave that to her PCP to discuss and potentially wean her off and switch to a different medication.  Syncope Mrs. Beg has had several episodes of syncope.the first one was last summer and the most recent one prompted a hospitalization on 01/02/16. She has had a normal 2-D echo in the past and a Myoview that showed a small area of apical ischemia but she really denies significant chest pain. She has had a cardiac catheterization performed 10 years ago that was unremarkable. She is aware that she cannot drive for at least 6 months. She is on a beta blocker with a heart rate of 53 which may be contributory and apparently was recently started on Xarelto by her primary care provider because of what was noted on her event monitor which I assume was A. Fib.I am a little concerned about her being on oral anticoagulant given her recent episodes of syncope without etiology.      Maria Harp MD FACP,FACC,FAHA, [facility] 02/13/2016 10:23 AM

## 2016-02-13 NOTE — Assessment & Plan Note (Signed)
chronic

## 2016-02-13 NOTE — Patient Instructions (Signed)
Medication Instructions:  Your physician recommends that you continue on your current medications as directed. Please refer to the Current Medication list given to you today.   Labwork: none  Testing/Procedures: none  Follow-Up: Follow up with Dr. Berry as needed.   Any Other Special Instructions Will Be Listed Below (If Applicable).     If you need a refill on your cardiac medications before your next appointment, please call your pharmacy.   

## 2016-02-13 NOTE — Assessment & Plan Note (Signed)
History of hypertension with blood pressure measured today at 124/74. She is on carvedilol Her lisinopril was stopped during her recent hospitalization because of orthostatic hypotension. Her heart rate today is 53. I'm not sure carvedilol is the best drug for her given her recent syncope and recurrent bradycardia. I will leave that to her PCP to discuss and potentially wean her off and switch to a different medication.

## 2016-02-13 NOTE — Assessment & Plan Note (Addendum)
Maria [name] has had several episodes of syncope.the first one was last summer and the most recent one prompted a hospitalization on 01/02/16. She has had a normal 2-D echo in the past and a Myoview that showed a small area of apical ischemia but she really denies significant chest pain. She has had a cardiac catheterization performed 10 years ago that was unremarkable. She is aware that she cannot drive for at least 6 months. She is on a beta blocker with a heart rate of 53 which may be contributory and apparently was recently started on Xarelto by her primary care provider because of what was noted on her event monitor which I assume was A. Fib.I am a little concerned about her being on oral anticoagulant given her recent episodes of syncope without etiology.
